# Patient Record
Sex: Female | Born: 1990 | Race: White | Hispanic: No | Marital: Single | Smoking: Never smoker
Health system: Southern US, Community
[De-identification: ages and names within clinical notes are randomized; demographics above are authoritative.]

## PROBLEM LIST (undated history)

## (undated) ENCOUNTER — Inpatient Hospital Stay (HOSPITAL_COMMUNITY): Payer: Self-pay

## (undated) DIAGNOSIS — Z789 Other specified health status: Secondary | ICD-10-CM

## (undated) HISTORY — PX: NO PAST SURGERIES: SHX2092

---

## 2009-03-10 ENCOUNTER — Emergency Department (HOSPITAL_COMMUNITY): Admission: EM | Admit: 2009-03-10 | Discharge: 2009-03-10 | Payer: Self-pay | Admitting: Emergency Medicine

## 2015-06-01 ENCOUNTER — Inpatient Hospital Stay (HOSPITAL_COMMUNITY)
Admission: AD | Admit: 2015-06-01 | Discharge: 2015-06-01 | Disposition: A | Discharge status: Home / Self Care | Payer: Medicaid Other | Source: Ambulatory Visit | Attending: Family Medicine | Admitting: Family Medicine

## 2015-06-01 ENCOUNTER — Encounter (HOSPITAL_COMMUNITY): Payer: Self-pay

## 2015-06-01 DIAGNOSIS — R112 Nausea with vomiting, unspecified: Secondary | ICD-10-CM | POA: Diagnosis present

## 2015-06-01 DIAGNOSIS — Z3A17 17 weeks gestation of pregnancy: Secondary | ICD-10-CM | POA: Diagnosis not present

## 2015-06-01 DIAGNOSIS — O9989 Other specified diseases and conditions complicating pregnancy, childbirth and the puerperium: Secondary | ICD-10-CM | POA: Diagnosis not present

## 2015-06-01 DIAGNOSIS — O26892 Other specified pregnancy related conditions, second trimester: Secondary | ICD-10-CM | POA: Diagnosis not present

## 2015-06-01 DIAGNOSIS — K529 Noninfective gastroenteritis and colitis, unspecified: Secondary | ICD-10-CM

## 2015-06-01 DIAGNOSIS — A084 Viral intestinal infection, unspecified: Secondary | ICD-10-CM | POA: Diagnosis not present

## 2015-06-01 HISTORY — DX: Other specified health status: Z78.9

## 2015-06-01 LAB — URINALYSIS, ROUTINE W REFLEX MICROSCOPIC
BILIRUBIN URINE: NEGATIVE
GLUCOSE, UA: NEGATIVE mg/dL
HGB URINE DIPSTICK: NEGATIVE
Ketones, ur: >80 mg/dL — AB
Leukocytes, UA: NEGATIVE
Nitrite: NEGATIVE
PH: 6 (ref 5.0–8.0)
Protein, ur: NEGATIVE mg/dL
SPECIFIC GRAVITY, URINE: 1.025 (ref 1.005–1.030)

## 2015-06-01 LAB — COMPREHENSIVE METABOLIC PANEL
ALT: 31 U/L (ref 14–54)
AST: 35 U/L (ref 15–41)
Albumin: 3.4 g/dL — ABNORMAL LOW (ref 3.5–5.0)
Alkaline Phosphatase: 45 U/L (ref 38–126)
Anion gap: 8 (ref 5–15)
BUN: 5 mg/dL — ABNORMAL LOW (ref 6–20)
CO2: 22 mmol/L (ref 22–32)
Calcium: 7.9 mg/dL — ABNORMAL LOW (ref 8.9–10.3)
Chloride: 108 mmol/L (ref 101–111)
Creatinine, Ser: 0.45 mg/dL (ref 0.44–1.00)
GFR calc Af Amer: >60 mL/min (ref >60)
GFR calc non Af Amer: >60 mL/min (ref >60)
Glucose, Bld: 76 mg/dL (ref 65–99)
Potassium: 3.6 mmol/L (ref 3.5–5.1)
Sodium: 138 mmol/L (ref 135–145)
Total Bilirubin: 0.8 mg/dL (ref 0.3–1.2)
Total Protein: 6.6 g/dL (ref 6.5–8.1)

## 2015-06-01 LAB — CBC
HCT: 34.9 % — ABNORMAL LOW (ref 36.0–46.0)
Hemoglobin: 12.4 g/dL (ref 12.0–15.0)
MCH: 32.7 pg (ref 26.0–34.0)
MCHC: 35.5 g/dL (ref 30.0–36.0)
MCV: 92.1 fL (ref 78.0–100.0)
Platelets: 180 10*3/uL (ref 150–400)
RBC: 3.79 MIL/uL — ABNORMAL LOW (ref 3.87–5.11)
RDW: 13.3 % (ref 11.5–15.5)
WBC: 6.9 10*3/uL (ref 4.0–10.5)

## 2015-06-01 MED ORDER — PROMETHAZINE HCL 25 MG/ML IJ SOLN
25.0000 mg | Freq: Once | INTRAMUSCULAR | Status: AC
  Administered 2015-06-01: 25 mg via INTRAVENOUS
  Filled 2015-06-01: qty 1

## 2015-06-01 MED ORDER — LACTATED RINGERS IV BOLUS (SEPSIS)
1000.0000 mL | Freq: Once | INTRAVENOUS | Status: AC
  Administered 2015-06-01: 1000 mL via INTRAVENOUS

## 2015-06-01 MED ORDER — DIPHENOXYLATE-ATROPINE 2.5-0.025 MG PO TABS
1.0000 | ORAL_TABLET | Freq: Once | ORAL | Status: AC
  Administered 2015-06-01: 1 via ORAL
  Filled 2015-06-01: qty 1

## 2015-06-01 MED ORDER — ONDANSETRON HCL 8 MG PO TABS: 8.0000 mg | ORAL_TABLET | Freq: Three times a day (TID) | ORAL | Status: DC | PRN

## 2015-06-01 NOTE — Discharge Instructions (Signed)

## 2015-06-01 NOTE — MAU Provider Note (Signed)
  History     CSN: [id number]  Arrival date and time: 06/01/15 1626   First Provider Initiated Contact with Patient 06/01/15 1700      Chief Complaint  Patient presents with  . Nausea  . Emesis  . Diarrhea   HPI  Theresa Watts 25 y.o. G3P2000 @ [redacted]w[redacted]d presents with nusea, vomiting and diarrhea. She has had this for 4 days and is still not feeling better. She states that her children were sick with a stomach bug earlier this week.   Past Medical History  Diagnosis Date  . Medical history non-contributory     Past Surgical History  Procedure Laterality Date  . No past surgeries      No family history on file.  Social History  Substance Use Topics  . Smoking status: Never Smoker   . Smokeless tobacco: None  . Alcohol Use: No    Allergies:  Allergies  Allergen Reactions  . Celebrex [Celecoxib] Hives    No prescriptions prior to admission    Review of Systems  Constitutional: Negative for fever.  Gastrointestinal: Positive for nausea, vomiting, abdominal pain and diarrhea.  All other systems reviewed and are negative.  Physical Exam   Blood pressure 107/58, pulse 89, temperature 99 F (37.2 C), temperature source Oral, resp. rate 16, height  (1.6 m), weight 54.432 kg (120 lb).  Physical Exam  Nursing note and vitals reviewed. Constitutional: She is oriented to person, place, and time. She appears well-developed and well-nourished. No distress.  HENT:  Head: Normocephalic and atraumatic.  Cardiovascular: Normal rate.   Respiratory: No respiratory distress.  GI: Soft. There is no tenderness. There is no rebound and no guarding.  Neurological: She is alert and oriented to person, place, and time.  Skin: Skin is warm and dry.  Psychiatric: She has a normal mood and affect. Her behavior is normal. Judgment and thought content normal.    MAU Course  Procedures  MDM Iv Fluids x 2 bags and antiemtic plus lomotil. Pt is tolerating po fluids and  crackers. And is feeling better.  Assessment and Plan  Gastrointestinal Virus Zofran 8 mg po Discharge   Clemmons,Lori Grissett 06/01/2015, 7:53 PM

## 2015-06-01 NOTE — MAU Note (Signed)
Pt reports n/v/d x 3days.  Is unable to keep anything down.  Was seen at [facility] Wednesday evening and received 2L of fluid.  Pt reports losing 7 lbs since the beginning of pregnancy due to morning sickness.  Reports pain in RLQ.

## 2015-10-01 ENCOUNTER — Inpatient Hospital Stay (HOSPITAL_COMMUNITY)
Admission: AD | Admit: 2015-10-01 | Discharge: 2015-10-01 | Disposition: A | Discharge status: Home / Self Care | Payer: Medicaid Other | Source: Ambulatory Visit | Attending: Obstetrics & Gynecology | Admitting: Obstetrics & Gynecology

## 2015-10-01 ENCOUNTER — Encounter (HOSPITAL_COMMUNITY): Payer: Self-pay | Admitting: *Deleted

## 2015-10-01 DIAGNOSIS — G43009 Migraine without aura, not intractable, without status migrainosus: Secondary | ICD-10-CM

## 2015-10-01 DIAGNOSIS — Z3A35 35 weeks gestation of pregnancy: Secondary | ICD-10-CM | POA: Diagnosis not present

## 2015-10-01 DIAGNOSIS — O99353 Diseases of the nervous system complicating pregnancy, third trimester: Secondary | ICD-10-CM | POA: Diagnosis not present

## 2015-10-01 DIAGNOSIS — G44209 Tension-type headache, unspecified, not intractable: Secondary | ICD-10-CM | POA: Diagnosis present

## 2015-10-01 LAB — CBC WITH DIFFERENTIAL/PLATELET
BASOS PCT: 0 %
Basophils Absolute: 0 10*3/uL (ref 0.0–0.1)
Eosinophils Absolute: 0.1 10*3/uL (ref 0.0–0.7)
Eosinophils Relative: 1 %
HEMATOCRIT: 33.1 % — AB (ref 36.0–46.0)
HEMOGLOBIN: 11.3 g/dL — AB (ref 12.0–15.0)
Lymphocytes Relative: 18 %
Lymphs Abs: 1.5 10*3/uL (ref 0.7–4.0)
MCH: 32.6 pg (ref 26.0–34.0)
MCHC: 34.1 g/dL (ref 30.0–36.0)
MCV: 95.4 fL (ref 78.0–100.0)
MONOS PCT: 3 %
Monocytes Absolute: 0.3 10*3/uL (ref 0.1–1.0)
NEUTROS ABS: 6.3 10*3/uL (ref 1.7–7.7)
NEUTROS PCT: 78 %
Platelets: 165 10*3/uL (ref 150–400)
RBC: 3.47 MIL/uL — AB (ref 3.87–5.11)
RDW: 14 % (ref 11.5–15.5)
WBC: 8.1 10*3/uL (ref 4.0–10.5)

## 2015-10-01 LAB — URINALYSIS, ROUTINE W REFLEX MICROSCOPIC
Bilirubin Urine: NEGATIVE
GLUCOSE, UA: NEGATIVE mg/dL
Hgb urine dipstick: NEGATIVE
KETONES UR: NEGATIVE mg/dL
NITRITE: NEGATIVE
PROTEIN: NEGATIVE mg/dL
Specific Gravity, Urine: 1.025 (ref 1.005–1.030)
pH: 6 (ref 5.0–8.0)

## 2015-10-01 LAB — URINE MICROSCOPIC-ADD ON

## 2015-10-01 LAB — PROTEIN / CREATININE RATIO, URINE
Creatinine, Urine: 157 mg/dL
PROTEIN CREATININE RATIO: 0.11 mg/mg{creat} (ref 0.00–0.15)
TOTAL PROTEIN, URINE: 17 mg/dL

## 2015-10-01 LAB — COMPREHENSIVE METABOLIC PANEL
ALK PHOS: 68 U/L (ref 38–126)
ALT: 13 U/L — AB (ref 14–54)
ANION GAP: 5 (ref 5–15)
AST: 20 U/L (ref 15–41)
Albumin: 2.8 g/dL — ABNORMAL LOW (ref 3.5–5.0)
BILIRUBIN TOTAL: 0.8 mg/dL (ref 0.3–1.2)
BUN: 9 mg/dL (ref 6–20)
CALCIUM: 8.2 mg/dL — AB (ref 8.9–10.3)
CO2: 22 mmol/L (ref 22–32)
CREATININE: 0.46 mg/dL (ref 0.44–1.00)
Chloride: 109 mmol/L (ref 101–111)
Glucose, Bld: 83 mg/dL (ref 65–99)
Potassium: 3.4 mmol/L — ABNORMAL LOW (ref 3.5–5.1)
SODIUM: 136 mmol/L (ref 135–145)
Total Protein: 5.9 g/dL — ABNORMAL LOW (ref 6.5–8.1)

## 2015-10-01 MED ORDER — CYCLOBENZAPRINE HCL 10 MG PO TABS: 10.0000 mg | ORAL_TABLET | Freq: Three times a day (TID) | ORAL | Status: DC | PRN

## 2015-10-01 MED ORDER — BUTALBITAL-APAP-CAFFEINE 50-325-40 MG PO TABS
2.0000 | ORAL_TABLET | Freq: Once | ORAL | Status: AC
  Administered 2015-10-01: 2 via ORAL
  Filled 2015-10-01: qty 2

## 2015-10-01 MED ORDER — LACTATED RINGERS IV BOLUS (SEPSIS): 1000.0000 mL | Freq: Once | INTRAVENOUS | Status: DC

## 2015-10-01 MED ORDER — CYCLOBENZAPRINE HCL 10 MG PO TABS
10.0000 mg | ORAL_TABLET | Freq: Once | ORAL | Status: AC
  Administered 2015-10-01: 10 mg via ORAL
  Filled 2015-10-01: qty 1

## 2015-10-01 MED ORDER — ONDANSETRON 4 MG PO TBDP: 4.0000 mg | ORAL_TABLET | Freq: Four times a day (QID) | ORAL | Status: DC | PRN

## 2015-10-01 MED ORDER — BUTALBITAL-APAP-CAFFEINE 50-325-40 MG PO CAPS: 1.0000 | ORAL_CAPSULE | Freq: Four times a day (QID) | ORAL | Status: DC | PRN

## 2015-10-01 NOTE — MAU Note (Signed)
Urine in lab 

## 2015-10-01 NOTE — MAU Provider Note (Signed)
MAU HISTORY AND PHYSICAL  Chief Complaint:  Tension headache  Cathleen CortiBrittany Seal is a 25 y.o.  G3P2000 with IUP at 3983w2d presenting for tension headache.  Prenatal risk factors: none Pt states she has had frontal headache, "vice grip", 10/10, spots in vision when intense, occurs usually during daytime hours, last 1-4 hours, improves with rest/dark room/ tylenol, associated n/v, no aura prior to starting.  States that this is her 3rd episode in the past 2 weeks.  Prior she has no history of migraines or Fhx. 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  Boyfriend is providing some support but she still completes a majority of house tasks in addition to caring for her 2 other children.  Otherwise reports an unremarkable pregnancy and plans for BTL (consent signed for already) after delivery.  Denies any RUQ pain, CP, SOB, BLE swelling, VB, LOF, urinary symptoms, fever, changes in personality, slurred speech, facial deviation, weakness/numbness in extremities.   Past Medical History  Diagnosis Date  . Medical history non-contributory     Past Surgical History  Procedure Laterality Date  . No past surgeries      No family history on file.  Social History  Substance Use Topics  . Smoking status: Never Smoker   . Smokeless tobacco: None  . Alcohol Use: No    Allergies  Allergen Reactions  . Celebrex [Celecoxib] Hives    Prescriptions prior to admission  Medication Sig Dispense Refill Last Dose  . acetaminophen (TYLENOL) 500 MG tablet Take 500 mg by mouth every 6 (six) hours as needed for mild pain.   Past Week at Unknown time  . ondansetron (ZOFRAN) 8 MG tablet Take 1 tablet (8 mg total) by mouth every 8 (eight) hours as needed for nausea or vomiting. (Patient not taking: Reported on 10/01/2015) 30 tablet 0 Not Taking at Unknown time    Review of Systems - Negative except for what is mentioned in HPI.  Physical Exam  Blood pressure 121/74, pulse 92, temperature 98.4  F (36.9 C), temperature source Oral, resp. rate 12, SpO2 100 %. GENERAL: Well-developed, well-nourished female in no acute distress.  LUNGS: Clear to auscultation bilaterally.  HEART: Regular rate and rhythm. ABDOMEN: Soft, nontender, nondistended, gravid.  EXTREMITIES: Nontender, no edema, 2+ distal pulses. Cervical Exam: Deferred Presentation: cephalic FHT:  Baseline 150 (moving), mod variability, +acels, no decels- reactive Contractions:None   Labs: No results found for this or any previous visit (from the past 24 hour(s)).  Imaging Studies:  No results found.  Assessment: Cathleen CortiBrittany Vankleeck is  25 y.o. G3P2000 at 783w2d presents with tension headache.  Plan: #Tension HA -Give cocktail (LR bolus, fiorcet, flexeril) -PIH labs (cbc, cmp, upc)- wnl -Reassess after therapy, d/c to home with flexeril and some fiorcet.  -F/u with OB pcp as scheduled  Olena LeatherwoodKelly M Jearldine Cassady 6/13/201710:42 AM

## 2015-10-01 NOTE — Discharge Instructions (Signed)

## 2015-10-01 NOTE — MAU Note (Signed)
Pt. States that she has been having migraines for about 2 weeks now. They get so bad that pt. Gets nauseated and she starts seeing dots.  Pt. States at her doctors appointments her BP and urine come back fine.  Pt. States that today that she is having lower abdominal pressure.  Denies any bleeding or leaking of fluid.

## 2015-10-01 NOTE — MAU Provider Note (Signed)
Filed Vitals:   10/01/15 0900  BP: 121/74  Pulse: 92  Temp: 98.4 F (36.9 C)  Resp: 12   I assessed the patient. HA is completely resolved with medication and fluid hydration. I am no concerned for preeclampsia at this point given her normal blood pressures. Her CMP and CBC were wnl. UA was negative for protein.   Federico FlakeKimberly Niles Teryl Mcconaghy, MD

## 2016-04-05 ENCOUNTER — Encounter (HOSPITAL_COMMUNITY): Payer: Self-pay

## 2016-06-08 ENCOUNTER — Encounter: Payer: Self-pay | Admitting: Physician Assistant

## 2016-06-08 ENCOUNTER — Encounter (INDEPENDENT_AMBULATORY_CARE_PROVIDER_SITE_OTHER): Payer: Self-pay

## 2016-06-08 ENCOUNTER — Ambulatory Visit (INDEPENDENT_AMBULATORY_CARE_PROVIDER_SITE_OTHER): Payer: Medicaid Other | Admitting: Physician Assistant

## 2016-06-08 VITALS — BP 129/86 | HR 106 | Temp 98.8°F | Ht 63.0 in | Wt 122.0 lb

## 2016-06-08 DIAGNOSIS — F411 Generalized anxiety disorder: Secondary | ICD-10-CM | POA: Diagnosis not present

## 2016-06-08 DIAGNOSIS — R5383 Other fatigue: Secondary | ICD-10-CM | POA: Diagnosis not present

## 2016-06-08 DIAGNOSIS — F988 Other specified behavioral and emotional disorders with onset usually occurring in childhood and adolescence: Secondary | ICD-10-CM

## 2016-06-08 MED ORDER — AMPHETAMINE-DEXTROAMPHET ER 20 MG PO CP24: 20.0000 mg | ORAL_CAPSULE | Freq: Every day | ORAL | 0 refills | Status: DC

## 2016-06-08 MED ORDER — CITALOPRAM HYDROBROMIDE 20 MG PO TABS
20.0000 mg | ORAL_TABLET | Freq: Every day | ORAL | 5 refills | Status: DC
Start: 2016-06-08 — End: 2016-08-12

## 2016-06-08 NOTE — Progress Notes (Signed)
BP 129/86   Pulse (!) 106   Temp 98.8 F (37.1 C) (Oral)   Ht _0  (1.6 m)   Wt 122 lb (55.3 kg)   BMI 21.61 kg/m    Subjective:    Patient ID: Theresa Watts, female    DOB: 03-02-1991, 26 y.o.   MRN: [id number]  Theresa Watts is a 26 y.o. female presenting on 06/08/2016 for New Patient (Initial Visit); Establish Care; Fatigue; and Trouble focusing  HPI Patient here to be established as new patient at [facility].    She has significant complaints of feeling severe anxiety whenever she is out and about. She is currently enrolled in school. When she is at school she is very anxious. She has difficulty going into stores. She tends to want to be at home the most. She has not had any specific trauma or injury that she can remember. She also has had a significant decrease in her ability to pay attention related to school. 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 She had some issues as a child with ADHD. We have had a long conversation concerning the need to treat the anxiety underneath that is probably aggravating her attention and then to treat her attention. She is agreeable. Past Medical History:  Diagnosis Date  . Medical history non-contributory    Relevant past medical, surgical, family and social history reviewed and updated as indicated. Interim medical history since our last visit reviewed. Allergies and medications reviewed and updated.   Data reviewed from any sources in EPIC.  Review of Systems  Constitutional: Negative.  Negative for activity change, fatigue and fever.  HENT: Negative.   Eyes: Negative.   Respiratory: Negative.  Negative for cough.   Cardiovascular: Negative.  Negative for chest pain.  Gastrointestinal: Negative.  Negative for abdominal pain.  Endocrine: Negative.   Genitourinary: Negative.  Negative for dysuria.    Musculoskeletal: Negative.   Skin: Negative.   Neurological: Negative.   Psychiatric/Behavioral: Positive for decreased concentration and dysphoric mood. Negative for sleep disturbance and suicidal ideas. The patient is nervous/anxious. The patient is not hyperactive.      Social History   Social History  . Marital status: Single    Spouse name: N/A  . Number of children: N/A  . Years of education: N/A   Occupational History  . Not on file.   Social History Main Topics  . Smoking status: Never Smoker  . Smokeless tobacco: Never Used  . Alcohol use No  . Drug use: No  . Sexual activity: Yes   Other Topics Concern  . Not on file   Social History Narrative  . No narrative on file    Past Surgical History:  Procedure Laterality Date  . NO PAST SURGERIES      History reviewed. No pertinent family history.  Allergies as of 06/08/2016      Reactions   Celebrex [celecoxib] Hives      Medication List       Accurate as of 06/08/16  8:44 PM. Always use your most recent med list.          amphetamine-dextroamphetamine 20 MG 24 hr capsule Commonly known as:  ADDERALL XR Take 1 capsule (20 mg total) by mouth daily.   citalopram 20 MG tablet Commonly known as:  CELEXA Take 1 tablet (20 mg total) by  mouth daily.          Objective:    BP 129/86   Pulse (!) 106   Temp 98.8 F (37.1 C) (Oral)   Ht _0  (1.6 m)   Wt 122 lb (55.3 kg)   BMI 21.61 kg/m   Allergies  Allergen Reactions  . Celebrex [Celecoxib] Hives   Wt Readings from Last 3 Encounters:  06/08/16 122 lb (55.3 kg)  06/01/15 120 lb (54.4 kg)    Physical Exam  Constitutional: She is oriented to person, place, and time. She appears well-developed and well-nourished.  HENT:  Head: Normocephalic and atraumatic.  Eyes: Conjunctivae and EOM are normal. Pupils are equal, round, and reactive to light.  Cardiovascular: Normal rate, regular rhythm, normal heart sounds and intact distal pulses.    Pulmonary/Chest: Effort normal and breath sounds normal.  Abdominal: Soft. Bowel sounds are normal.  Neurological: She is alert and oriented to person, place, and time. She has normal reflexes.  Skin: Skin is warm and dry. No rash noted.  Psychiatric: She has a normal mood and affect. Her behavior is normal. Judgment and thought content normal.  Nursing note and vitals reviewed.       Assessment & Plan:   1. Attention deficit disorder (ADD) without hyperactivity - amphetamine-dextroamphetamine (ADDERALL XR) 20 MG 24 hr capsule; Take 1 capsule (20 mg total) by mouth daily.  Dispense: 30 capsule; Refill: 0  2. GAD (generalized anxiety disorder) - citalopram (CELEXA) 20 MG tablet; Take 1 tablet (20 mg total) by mouth daily.  Dispense: 30 tablet; Refill: 5  3. Other fatigue - CBC with Differential/Platelet - CMP14+EGFR - Thyroid Panel With TSH   Continue all other maintenance medications as listed above. Educational handout given for adjustment disorder  Follow up plan: Return in about 4 weeks (around 07/06/2016) for recheck.  Terald Sleeper PA-C [facility] [address]  [place], [place] [address] [phone]   06/08/2016, 8:44 PM

## 2016-06-08 NOTE — Patient Instructions (Signed)
Adjustment Disorder Adjustment disorder is an unusually severe reaction to a stressful life event, such as the loss of a job or physical illness. The event may be any stressful event other than the loss of a loved one. Adjustment disorder may affect your feelings, your thinking, how you act, or a combination of these. It may interfere with personal relationships or with the way you are at work, school, or home. People with this disorder are at risk for suicide and substance abuse. They may develop a more serious mental disorder, such as major depressive disorder or post-traumatic stress disorder. SIGNS AND SYMPTOMS  Symptoms may include:  Sadness, depressed mood, or crying spells.  Loss of enjoyment.  Change in appetite or weight.  Sense of loss or hopelessness.  Thoughts of suicide.  Anxiety, worry, or nervousness.  Trouble sleeping.  Avoiding family and friends.  Poor school performance.  Fighting or vandalism.  Reckless driving.  Skipping school.  Poor work performance.  Ignoring bills. Symptoms of adjustment disorder start within 3 months of the stressful life event. They do not last more than 6 months after the event has ended. DIAGNOSIS  To make a diagnosis, your health care provider will ask about what has happened in your life and how it has affected you. He or she may also ask about your medical history and use of medicines, alcohol, and other substances. Your health care provider may do a physical exam and order lab tests or other studies. You may be referred to a mental health specialist for evaluation. TREATMENT  Treatment options include:  Counseling or talk therapy. Talk therapy is usually provided by mental health specialists.  Medicine. Certain medicines may help with depression, anxiety, and sleep.  Support groups. Support groups offer emotional support, advice, and guidance. They are made up of people who have had similar experiences. HOME CARE  INSTRUCTIONS  Keep all follow-up visits as directed by your health care provider. This is important.  Take medicines only as directed by your health care provider. SEEK MEDICAL CARE IF:  Your symptoms get worse.  SEEK IMMEDIATE MEDICAL CARE IF: You have serious thoughts about hurting yourself or someone else. MAKE SURE YOU:  Understand these instructions.  Will watch your condition.  Will get help right away if you are not doing well or get worse. This information is not intended to replace advice given to you by your health care provider. Make sure you discuss any questions you have with your health care provider. Document Released: 12/09/2005 Document Revised: 07/29/2015 Document Reviewed: 08/29/2013 Elsevier Interactive Patient Education  2017 Elsevier Inc.  

## 2016-06-09 LAB — CMP14+EGFR
A/G RATIO: 1.5 (ref 1.2–2.2)
ALBUMIN: 4.4 g/dL (ref 3.5–5.5)
ALT: 15 IU/L (ref 0–32)
AST: 16 IU/L (ref 0–40)
Alkaline Phosphatase: 61 IU/L (ref 39–117)
BILIRUBIN TOTAL: 0.8 mg/dL (ref 0.0–1.2)
BUN / CREAT RATIO: 9 (ref 9–23)
BUN: 8 mg/dL (ref 6–20)
CALCIUM: 8.9 mg/dL (ref 8.7–10.2)
CO2: 20 mmol/L (ref 18–29)
Chloride: 106 mmol/L (ref 96–106)
Creatinine, Ser: 0.93 mg/dL (ref 0.57–1.00)
GFR, EST AFRICAN AMERICAN: 97 mL/min/{1.73_m2} (ref >59)
GFR, EST NON AFRICAN AMERICAN: 84 mL/min/{1.73_m2} (ref >59)
GLOBULIN, TOTAL: 2.9 g/dL (ref 1.5–4.5)
Glucose: 95 mg/dL (ref 65–99)
POTASSIUM: 4.2 mmol/L (ref 3.5–5.2)
Sodium: 142 mmol/L (ref 134–144)
TOTAL PROTEIN: 7.3 g/dL (ref 6.0–8.5)

## 2016-06-09 LAB — CBC WITH DIFFERENTIAL/PLATELET
BASOS: 0 %
Basophils Absolute: 0 10*3/uL (ref 0.0–0.2)
EOS (ABSOLUTE): 0 10*3/uL (ref 0.0–0.4)
EOS: 1 %
HEMATOCRIT: 40.9 % (ref 34.0–46.6)
HEMOGLOBIN: 13.7 g/dL (ref 11.1–15.9)
IMMATURE GRANS (ABS): 0 10*3/uL (ref 0.0–0.1)
IMMATURE GRANULOCYTES: 0 %
LYMPHS: 20 %
Lymphocytes Absolute: 1.8 10*3/uL (ref 0.7–3.1)
MCH: 31.2 pg (ref 26.6–33.0)
MCHC: 33.5 g/dL (ref 31.5–35.7)
MCV: 93 fL (ref 79–97)
MONOCYTES: 3 %
Monocytes Absolute: 0.3 10*3/uL (ref 0.1–0.9)
Neutrophils Absolute: 6.7 10*3/uL (ref 1.4–7.0)
Neutrophils: 76 %
PLATELETS: 258 10*3/uL (ref 150–379)
RBC: 4.39 x10E6/uL (ref 3.77–5.28)
RDW: 13.3 % (ref 12.3–15.4)
WBC: 8.9 10*3/uL (ref 3.4–10.8)

## 2016-06-09 LAB — THYROID PANEL WITH TSH
FREE THYROXINE INDEX: 2.1 (ref 1.2–4.9)
T3 UPTAKE RATIO: 33 % (ref 24–39)
T4, Total: 6.4 ug/dL (ref 4.5–12.0)
TSH: 0.475 u[IU]/mL (ref 0.450–4.500)

## 2016-07-10 ENCOUNTER — Encounter: Payer: Self-pay | Admitting: Physician Assistant

## 2016-07-10 ENCOUNTER — Ambulatory Visit (INDEPENDENT_AMBULATORY_CARE_PROVIDER_SITE_OTHER): Payer: Medicaid Other | Admitting: Physician Assistant

## 2016-07-10 VITALS — BP 128/83 | HR 111 | Temp 97.7°F | Ht 63.0 in | Wt 117.8 lb

## 2016-07-10 DIAGNOSIS — F411 Generalized anxiety disorder: Secondary | ICD-10-CM

## 2016-07-10 DIAGNOSIS — F988 Other specified behavioral and emotional disorders with onset usually occurring in childhood and adolescence: Secondary | ICD-10-CM

## 2016-07-10 MED ORDER — METHYLPHENIDATE HCL ER 36 MG PO TB24: 36.0000 mg | ORAL_TABLET | Freq: Every day | ORAL | 0 refills | Status: DC

## 2016-07-10 NOTE — Patient Instructions (Signed)
Generalized Anxiety Disorder, Adult Generalized anxiety disorder (GAD) is a mental health disorder. People with this condition constantly worry about everyday events. Unlike normal anxiety, worry related to GAD is not triggered by a specific event. These worries also do not fade or get better with time. GAD interferes with life functions, including relationships, work, and school. GAD can vary from mild to severe. People with severe GAD can have intense waves of anxiety with physical symptoms (panic attacks). What are the causes? The exact cause of GAD is not known. What increases the risk? This condition is more likely to develop in:  Women.  People who have a family history of anxiety disorders.  People who are very shy.  People who experience very stressful life events, such as the death of a loved one.  People who have a very stressful family environment. What are the signs or symptoms? People with GAD often worry excessively about many things in their lives, such as their health and family. They may also be overly concerned about:  Doing well at work.  Being on time.  Natural disasters.  Friendships. Physical symptoms of GAD include:  Fatigue.  Muscle tension or having muscle twitches.  Trembling or feeling shaky.  Being easily startled.  Feeling like your heart is pounding or racing.  Feeling out of breath or like you cannot take a deep breath.  Having trouble falling asleep or staying asleep.  Sweating.  Nausea, diarrhea, or irritable bowel syndrome (IBS).  Headaches.  Trouble concentrating or remembering facts.  Restlessness.  Irritability. How is this diagnosed? Your health care provider can diagnose GAD based on your symptoms and medical history. You will also have a physical exam. The health care provider will ask specific questions about your symptoms, including how severe they are, when they started, and if they come and go. Your health care  provider may ask you about your use of alcohol or drugs, including prescription medicines. Your health care provider may refer you to a mental health specialist for further evaluation. Your health care provider will do a thorough examination and may perform additional tests to rule out other possible causes of your symptoms. To be diagnosed with GAD, a person must have anxiety that:  Is out of his or her control.  Affects several different aspects of his or her life, such as work and relationships.  Causes distress that makes him or her unable to take part in normal activities.  Includes at least three physical symptoms of GAD, such as restlessness, fatigue, trouble concentrating, irritability, muscle tension, or sleep problems. Before your health care provider can confirm a diagnosis of GAD, these symptoms must be present more days than they are not, and they must last for six months or longer. How is this treated? The following therapies are usually used to treat GAD:  Medicine. Antidepressant medicine is usually prescribed for long-term daily control. Antianxiety medicines may be added in severe cases, especially when panic attacks occur.  Talk therapy (psychotherapy). Certain types of talk therapy can be helpful in treating GAD by providing support, education, and guidance. Options include:  Cognitive behavioral therapy (CBT). People learn coping skills and techniques to ease their anxiety. They learn to identify unrealistic or negative thoughts and behaviors and to replace them with positive ones.  Acceptance and commitment therapy (ACT). This treatment teaches people how to be mindful as a way to cope with unwanted thoughts and feelings.  Biofeedback. This process trains you to manage your body's response (  physiological response) through breathing techniques and relaxation methods. You will work with a therapist while machines are used to monitor your physical symptoms.  Stress  management techniques. These include yoga, meditation, and exercise. A mental health specialist can help determine which treatment is best for you. Some people see improvement with one type of therapy. However, other people require a combination of therapies. Follow these instructions at home:  Take over-the-counter and prescription medicines only as told by your health care provider.  Try to maintain a normal routine.  Try to anticipate stressful situations and allow extra time to manage them.  Practice any stress management or self-calming techniques as taught by your health care provider.  Do not punish yourself for setbacks or for not making progress.  Try to recognize your accomplishments, even if they are small.  Keep all follow-up visits as told by your health care provider. This is important. Contact a health care provider if:  Your symptoms do not get better.  Your symptoms get worse.  You have signs of depression, such as:  A persistently sad, cranky, or irritable mood.  Loss of enjoyment in activities that used to bring you joy.  Change in weight or eating.  Changes in sleeping habits.  Avoiding friends or family members.  Loss of energy for normal tasks.  Feelings of guilt or worthlessness. Get help right away if:  You have serious thoughts about hurting yourself or others. If you ever feel like you may hurt yourself or others, or have thoughts about taking your own life, get help right away. You can go to your nearest emergency department or call:  Your local emergency services (911 in the [place]  A suicide crisis helpline, such as the National Suicide Prevention Lifeline at 1-800-273-8255. This is open 24 hours a day. Summary  Generalized anxiety disorder (GAD) is a mental health disorder that involves worry that is not triggered by a specific event.  People with GAD often worry excessively about many things in their lives, such as their health and  family.  GAD may cause physical symptoms such as restlessness, trouble concentrating, sleep problems, frequent sweating, nausea, diarrhea, headaches, and trembling or muscle twitching.  A mental health specialist can help determine which treatment is best for you. Some people see improvement with one type of therapy. However, other people require a combination of therapies. This information is not intended to replace advice given to you by your health care provider. Make sure you discuss any questions you have with your health care provider. Document Released: 08/01/2012 Document Revised: 02/25/2016 Document Reviewed: 02/25/2016 Elsevier Interactive Patient Education  2017 [employer]vier Inc.  

## 2016-07-10 NOTE — Progress Notes (Signed)
BP 128/83   Pulse (!) 111   Temp 97.7 F (36.5 C) (Oral)   Ht 5\' 3"  (1.6 m)   Wt 117 lb 12.8 oz (53.4 kg)   BMI 20.87 kg/m    Subjective:    Patient ID: Theresa Watts, female    DOB: 07-07-1990, 26 y.o.   MRN: [id number]  HPI: Theresa Watts is a 26 y.o. female presenting on 07/10/2016 for Follow-up (3 week rck. Patient states that she does not see much change since starting the adderall)  This patient comes in for periodic recheck on medications and conditions including ADHD and anxiety. She is feeling very improved in her anxiety and social anxiety at school. She was able to give a presentation with a little anxiety associated. The ADHD medication is not helping very much. She has had a great decrease in her appetite. She is awake but also having difficulty going to sleep at night. We are to change medications..   All medications are reviewed today. There are no reports of any problems with the medications. All of the medical conditions are reviewed and updated.  Lab work is reviewed and will be ordered as medically necessary. There are no new problems reported with today's visit.   Relevant past medical, surgical, family and social history reviewed and updated as indicated. Allergies and medications reviewed and updated.  Past Medical History:  Diagnosis Date  . Medical history non-contributory     Past Surgical History:  Procedure Laterality Date  . NO PAST SURGERIES      Review of Systems  Constitutional: Negative.  Negative for activity change, fatigue and fever.  HENT: Negative.   Eyes: Negative.   Respiratory: Negative.  Negative for cough.   Cardiovascular: Negative.  Negative for chest pain.  Gastrointestinal: Negative.  Negative for abdominal pain.  Endocrine: Negative.   Genitourinary: Negative.  Negative for dysuria.  Musculoskeletal: Negative.   Skin: Negative.   Neurological: Negative.     Allergies as of 07/10/2016      Reactions   Celebrex  [celecoxib] Hives      Medication List       Accurate as of 07/10/16  4:30 PM. Always use your most recent med list.          citalopram 20 MG tablet Commonly known as:  CELEXA Take 1 tablet (20 mg total) by mouth daily.   methylphenidate 36 MG CR tablet Commonly known as:  CONCERTA Take 1 tablet (36 mg total) by mouth daily.          Objective:    BP 128/83   Pulse (!) 111   Temp 97.7 F (36.5 C) (Oral)   Ht 5\' 3"  (1.6 m)   Wt 117 lb 12.8 oz (53.4 kg)   BMI 20.87 kg/m   Allergies  Allergen Reactions  . Celebrex [Celecoxib] Hives    Physical Exam  Constitutional: She is oriented to person, place, and time. She appears well-developed and well-nourished.  HENT:  Head: Normocephalic and atraumatic.  Right Ear: Tympanic membrane, external ear and ear canal normal.  Left Ear: Tympanic membrane, external ear and ear canal normal.  Nose: Nose normal. No rhinorrhea.  Mouth/Throat: Oropharynx is clear and moist and mucous membranes are normal. No oropharyngeal exudate or posterior oropharyngeal erythema.  Eyes: Conjunctivae and EOM are normal. Pupils are equal, round, and reactive to light.  Neck: Normal range of motion. Neck supple.  Cardiovascular: Normal rate, regular rhythm, normal heart sounds and intact distal pulses.  Pulmonary/Chest: Effort normal and breath sounds normal.  Abdominal: Soft. Bowel sounds are normal.  Neurological: She is alert and oriented to person, place, and time. She has normal reflexes.  Skin: Skin is warm and dry. No rash noted.  Psychiatric: She has a normal mood and affect. Her behavior is normal. Judgment and thought content normal.        Assessment & Plan:   1. GAD (generalized anxiety disorder)  2. Attention deficit disorder (ADD) without hyperactivity - methylphenidate 36 MG PO CR tablet; Take 1 tablet (36 mg total) by mouth daily.  Dispense: 30 tablet; Refill: 0 Stop adderall   Current Outpatient Prescriptions:  .   citalopram (CELEXA) 20 MG tablet, Take 1 tablet (20 mg total) by mouth daily., Disp: 30 tablet, Rfl: 5 .  methylphenidate 36 MG PO CR tablet, Take 1 tablet (36 mg total) by mouth daily., Disp: 30 tablet, Rfl: 0  Continue all other maintenance medications as listed above.  Follow up plan: Return in about 4 weeks (around 08/07/2016) for recheck.  Educational handout given for adjustment disorder  Remus LofflerAngel S. Sloka Volante PA-C [facility] [address]  [place], [place] [address] [phone]   07/10/2016, 4:30 PM

## 2016-07-13 ENCOUNTER — Telehealth: Payer: Self-pay | Admitting: Physician Assistant

## 2016-07-13 MED ORDER — AMPHETAMINE-DEXTROAMPHETAMINE 10 MG PO TABS: 10.0000 mg | ORAL_TABLET | Freq: Every day | ORAL | 0 refills | Status: DC

## 2016-07-13 NOTE — Telephone Encounter (Signed)
lmtcb

## 2016-07-13 NOTE — Telephone Encounter (Signed)
Patient states her methylphenidate 36 mg is not working and would like to be switched to something else. Please advise and send back to the pools.

## 2016-07-13 NOTE — Telephone Encounter (Signed)
Can you find out the issue? She just started it over the weekend. Is there an allergy? Intolerance?

## 2016-07-13 NOTE — Telephone Encounter (Signed)
Patient states that she would like to go back Adderall not the extended release.

## 2016-07-13 NOTE — Telephone Encounter (Signed)
We can lower it to the very lowest dose. Or take this longer so that she gets used to it.

## 2016-07-13 NOTE — Telephone Encounter (Signed)
Patient states that she is jittery and all over the place. She said she feels like it is just to much. Please advise

## 2016-07-14 NOTE — Telephone Encounter (Signed)
Detailed message left for patient that rx is ready to be picked up 

## 2016-07-16 ENCOUNTER — Telehealth: Payer: Self-pay | Admitting: Physician Assistant

## 2016-07-16 NOTE — Telephone Encounter (Signed)
Pharmacy aware of the change & to stop the concerta

## 2016-07-16 NOTE — Telephone Encounter (Signed)
Covering for PCP  Per notes- this was an intentional change from her provider. Ok to fill.   Pt has stopped concerta.   Murtis SinkSam Dillin Lofgren, MD [facility] 07/16/2016, 1:56 PM

## 2016-08-12 ENCOUNTER — Encounter: Payer: Self-pay | Admitting: Physician Assistant

## 2016-08-12 ENCOUNTER — Ambulatory Visit (INDEPENDENT_AMBULATORY_CARE_PROVIDER_SITE_OTHER): Payer: Medicaid Other | Admitting: Physician Assistant

## 2016-08-12 VITALS — BP 131/78 | HR 87 | Temp 97.7°F | Ht 63.0 in | Wt 113.6 lb

## 2016-08-12 DIAGNOSIS — F988 Other specified behavioral and emotional disorders with onset usually occurring in childhood and adolescence: Secondary | ICD-10-CM

## 2016-08-12 DIAGNOSIS — F411 Generalized anxiety disorder: Secondary | ICD-10-CM | POA: Diagnosis not present

## 2016-08-12 MED ORDER — AMPHETAMINE-DEXTROAMPHETAMINE 10 MG PO TABS: 10.0000 mg | ORAL_TABLET | Freq: Two times a day (BID) | ORAL | 0 refills | Status: DC

## 2016-08-12 MED ORDER — CITALOPRAM HYDROBROMIDE 20 MG PO TABS: 20.0000 mg | ORAL_TABLET | Freq: Every day | ORAL | 5 refills | Status: DC

## 2016-08-12 MED ORDER — AMPHETAMINE-DEXTROAMPHETAMINE 10 MG PO TABS: 10.0000 mg | ORAL_TABLET | Freq: Every day | ORAL | 0 refills | Status: DC

## 2016-08-12 NOTE — Patient Instructions (Signed)

## 2016-08-12 NOTE — Progress Notes (Signed)
BP 131/78   Pulse 87   Temp 97.7 F (36.5 C) (Oral)   Ht  (1.6 m)   Wt 113 lb 9.6 oz (51.5 kg)   BMI 20.12 kg/m    Subjective:    Patient ID: Theresa Watts, female    DOB: 07-Nov-1990, 26 y.o.   MRN: [id number]  HPI: Theresa Watts is a 26 y.o. female presenting on 08/12/2016 for Follow-up (4 week )  This patient comes in for periodic recheck on medications and conditions including ADHD and depression.  She reports that she is doing extremely well overall. She is improved her grades tremendously. 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 She does have a little bit of jittery side effects early in the morning in the first couple hours after taking it. She would like to try taking the 10 mg dose that morning and months. There are no other complaints today.   All medications are reviewed today. There are no reports of any problems with the medications. All of the medical conditions are reviewed and updated.  Lab work is reviewed and will be ordered as medically necessary. There are no new problems reported with today's visit.   Relevant past medical, surgical, family and social history reviewed and updated as indicated. Allergies and medications reviewed and updated.  Past Medical History:  Diagnosis Date  . Medical history non-contributory     Past Surgical History:  Procedure Laterality Date  . NO PAST SURGERIES      Review of Systems  Constitutional: Negative.  Negative for activity change, fatigue and fever.  HENT: Negative.   Eyes: Negative.   Respiratory: Negative.  Negative for cough.   Cardiovascular: Negative.  Negative for chest pain.  Gastrointestinal: Negative.  Negative for abdominal pain.  Endocrine: Negative.   Genitourinary: Negative.  Negative for dysuria.  Musculoskeletal: Negative.   Skin: Negative.   Neurological: Negative.     Allergies as of 08/12/2016      Reactions   Celebrex [celecoxib]  Hives      Medication List       Accurate as of 08/12/16  4:27 PM. Always use your most recent med list.          amphetamine-dextroamphetamine 10 MG tablet Commonly known as:  ADDERALL Take 1-2 tablets (10-20 mg total) by mouth daily with breakfast.   amphetamine-dextroamphetamine 10 MG tablet Commonly known as:  ADDERALL Take 1 tablet (10 mg total) by mouth 2 (two) times daily.   amphetamine-dextroamphetamine 10 MG tablet Commonly known as:  ADDERALL Take 1 tablet (10 mg total) by mouth 2 (two) times daily.   citalopram 20 MG tablet Commonly known as:  CELEXA Take 1 tablet (20 mg total) by mouth daily.          Objective:    BP 131/78   Pulse 87   Temp 97.7 F (36.5 C) (Oral)   Ht  (1.6 m)   Wt 113 lb 9.6 oz (51.5 kg)   BMI 20.12 kg/m   Allergies  Allergen Reactions  . Celebrex [Celecoxib] Hives    Physical Exam  Constitutional: She is oriented to person, place, and time. She appears well-developed and well-nourished.  HENT:  Head: Normocephalic and atraumatic.  Eyes: Conjunctivae and EOM are normal. Pupils are equal, round, and reactive to light.  Cardiovascular: Normal rate, regular rhythm, normal heart sounds and intact distal pulses.   Pulmonary/Chest: Effort normal and breath sounds normal.  Abdominal: Soft. Bowel sounds are normal.  Neurological: She is alert and oriented to person, place, and time. She has normal reflexes.  Skin: Skin is warm and dry. No rash noted.  Psychiatric: She has a normal mood and affect. Her behavior is normal. Judgment and thought content normal.        Assessment & Plan:   1. GAD (generalized anxiety disorder) - citalopram (CELEXA) 20 MG tablet; Take 1 tablet (20 mg total) by mouth daily.  Dispense: 30 tablet; Refill: 5  2. Attention deficit disorder (ADD) without hyperactivity - amphetamine-dextroamphetamine (ADDERALL) 10 MG tablet; Take 1-2 tablets (10-20 mg total) by mouth daily with breakfast.  Dispense:  60 tablet; Refill: 0 - amphetamine-dextroamphetamine (ADDERALL) 10 MG tablet; Take 1 tablet (10 mg total) by mouth 2 (two) times daily.  Dispense: 60 tablet; Refill: 0 - amphetamine-dextroamphetamine (ADDERALL) 10 MG tablet; Take 1 tablet (10 mg total) by mouth 2 (two) times daily.  Dispense: 60 tablet; Refill: 0   Continue all other maintenance medications as listed above.  Follow up plan: Return in about 3 months (around 11/11/2016) for recheck.  Educational handout given for attention deficit  Remus Loffler PA-C [facility] [address]  [place], [place] [address] [phone]   08/12/2016, 4:27 PM

## 2016-10-09 ENCOUNTER — Ambulatory Visit (INDEPENDENT_AMBULATORY_CARE_PROVIDER_SITE_OTHER): Payer: Medicaid Other | Admitting: Physician Assistant

## 2016-10-09 ENCOUNTER — Encounter: Payer: Self-pay | Admitting: Physician Assistant

## 2016-10-09 VITALS — BP 123/77 | HR 89 | Temp 97.3°F | Ht 63.0 in | Wt 115.8 lb

## 2016-10-09 DIAGNOSIS — M7661 Achilles tendinitis, right leg: Secondary | ICD-10-CM | POA: Diagnosis not present

## 2016-10-09 MED ORDER — NAPROXEN 500 MG PO TABS: 500.0000 mg | ORAL_TABLET | Freq: Two times a day (BID) | ORAL | 1 refills | Status: DC

## 2016-10-09 NOTE — Patient Instructions (Signed)
Achilles Tendinitis  Achilles tendinitis is inflammation of the tough, cord-like band that attaches the lower leg muscles to the heel bone (Achilles tendon). This is usually caused by overusing the tendon and the ankle joint.  Achilles tendinitis usually gets better over time with treatment and caring for yourself at home. It can take weeks or months to heal completely.  What are the causes?  This condition may be caused by:  · A sudden increase in exercise or activity, such as running.  · Doing the same exercises or activities (such as jumping) over and over.  · Not warming up calf muscles before exercising.  · Exercising in shoes that are worn out or not made for exercise.  · Having arthritis or a bone growth (spur) on the back of the heel bone. This can rub against the tendon and hurt it.  · Age-related wear and tear. Tendons become less flexible with age and more likely to be injured.    What are the signs or symptoms?  Common symptoms of this condition include:  · Pain in the Achilles tendon or in the back of the leg, just above the heel. The pain usually gets worse with exercise.  · Stiffness or soreness in the back of the leg, especially in the morning.  · Swelling of the skin over the Achilles tendon.  · Thickening of the tendon.  · Bone spurs at the bottom of the Achilles tendon, near the heel.  · Trouble standing on tiptoe.    How is this diagnosed?  This condition is diagnosed based on your symptoms and a physical exam. You may have tests, including:  · X-rays.  · MRI.    How is this treated?  The goal of treatment is to relieve symptoms and help your injury heal. Treatment may include:  · Decreasing or stopping activities that caused the tendinitis. This may mean switching to low-impact exercises like biking or swimming.  · Icing the injured area.  · Doing physical therapy, including strengthening and stretching exercises.  · NSAIDs to help relieve pain and swelling.  · Using supportive shoes, wraps,  heel lifts, or a walking boot (air cast).  · Surgery. This may be done if your symptoms do not improve after 6 months.  · Using high-energy shock wave impulses to stimulate the healing process (extracorporeal shock wave therapy). This is rare.  · Injection of medicines to help relieve inflammation (corticosteroids). This is rare.    Follow these instructions at home:  If you have an air cast:   · Wear the cast as told by your health care provider. Remove it only as told by your health care provider.  · Loosen the cast if your toes tingle, become numb, or turn cold and blue.  Activity   · Gradually return to your normal activities once your health care provider approves. Do not do activities that cause pain.  ? Consider doing low-impact exercises, like cycling or swimming.  · If you have an air cast, ask your health care provider when it is safe for you to drive.  · If physical therapy was prescribed, do exercises as told by your health care provider or physical therapist.  Managing pain, stiffness, and swelling   · Raise (elevate) your foot above the level of your heart while you are sitting or lying down.  · Move your toes often to avoid stiffness and to lessen swelling.  · If directed, put ice on the injured area:  ?   Put ice in a plastic bag.  ? Place a towel between your skin and the bag.  ? Leave the ice on for 20 minutes, 2-3 times a day  General instructions   · If directed, wrap your foot with an elastic bandage or other wrap. This can help keep your tendon from moving too much while it heals. Your health care provider will show you how to wrap your foot correctly.  · Wear supportive shoes or heel lifts only as told by your health care provider.  · Take over-the-counter and prescription medicines only as told by your health care provider.  · Keep all follow-up visits as told by your health care provider. This is important.  Contact a health care provider if:  · You have symptoms that gets worse.  · You have  pain that does not get better with medicine.  · You develop new, unexplained symptoms.  · You develop warmth and swelling in your foot.  · You have a fever.  Get help right away if:  · You have a sudden popping sound or sensation in your Achilles tendon followed by severe pain.  · You cannot move your toes or foot.  · You cannot put any weight on your foot.  Summary  · Achilles tendinitis is inflammation of the tough, cord-like band that attaches the lower leg muscles to the heel bone (Achilles tendon).  · This condition is usually caused by overusing the tendon and the ankle joint. It can also be caused by arthritis or normal aging.  · The most common symptoms of this condition include pain, swelling, or stiffness in the Achilles tendon or in the back of the leg.  · This condition is usually treated with rest, NSAIDs, and physical therapy.  This information is not intended to replace advice given to you by your health care provider. Make sure you discuss any questions you have with your health care provider.  Document Released: 01/14/2005 Document Revised: 02/24/2016 Document Reviewed: 02/24/2016  Elsevier Interactive Patient Education © 2017 [employer]

## 2016-10-11 DIAGNOSIS — M7661 Achilles tendinitis, right leg: Secondary | ICD-10-CM | POA: Insufficient documentation

## 2016-10-11 NOTE — Progress Notes (Signed)
BP 123/77   Pulse 89   Temp 97.3 F (36.3 C) (Oral)   Ht 5\' 3"  (1.6 m)   Wt 115 lb 12.8 oz (52.5 kg)   BMI 20.51 kg/m    Subjective:    Patient ID: Theresa Watts, female    DOB: 1990/05/21, 26 y.o.   MRN: [id number]  HPI: Gavin Faivre is a 26 y.o. female presenting on 10/09/2016 for Foot Pain (bilateral and right side radiates up hip )    Relevant past medical, surgical, family and social history reviewed and updated as indicated. Allergies and medications reviewed and updated.  Past Medical History:  Diagnosis Date  . Medical history non-contributory     Past Surgical History:  Procedure Laterality Date  . NO PAST SURGERIES      Review of Systems  Constitutional: Negative.   HENT: Negative.   Eyes: Negative.   Respiratory: Negative.   Gastrointestinal: Negative.   Genitourinary: Negative.   Musculoskeletal: Positive for arthralgias, gait problem, joint swelling and myalgias.    Allergies as of 10/09/2016      Reactions   Celebrex [celecoxib] Hives      Medication List       Accurate as of 10/09/16 11:59 PM. Always use your most recent med list.          amphetamine-dextroamphetamine 10 MG tablet Commonly known as:  ADDERALL Take 1-2 tablets (10-20 mg total) by mouth daily with breakfast.   amphetamine-dextroamphetamine 10 MG tablet Commonly known as:  ADDERALL Take 1 tablet (10 mg total) by mouth 2 (two) times daily.   amphetamine-dextroamphetamine 10 MG tablet Commonly known as:  ADDERALL Take 1 tablet (10 mg total) by mouth 2 (two) times daily.   citalopram 20 MG tablet Commonly known as:  CELEXA Take 1 tablet (20 mg total) by mouth daily.   naproxen 500 MG tablet Commonly known as:  NAPROSYN Take 1 tablet (500 mg total) by mouth 2 (two) times daily with a meal.          Objective:    BP 123/77   Pulse 89   Temp 97.3 F (36.3 C) (Oral)   Ht 5\' 3"  (1.6 m)   Wt 115 lb 12.8 oz (52.5 kg)   BMI 20.51 kg/m   Allergies    Allergen Reactions  . Celebrex [Celecoxib] Hives    Physical Exam  Constitutional: She is oriented to person, place, and time. She appears well-developed and well-nourished.  HENT:  Head: Normocephalic and atraumatic.  Eyes: Conjunctivae and EOM are normal. Pupils are equal, round, and reactive to light.  Cardiovascular: Normal rate, regular rhythm, normal heart sounds and intact distal pulses.   Pulmonary/Chest: Effort normal and breath sounds normal.  Abdominal: Soft. Bowel sounds are normal.  Musculoskeletal:       Right ankle: She exhibits decreased range of motion, swelling and deformity.       Feet:  Neurological: She is alert and oriented to person, place, and time. She has normal reflexes.  Skin: Skin is warm and dry. No rash noted.  Psychiatric: She has a normal mood and affect. Her behavior is normal. Judgment and thought content normal.  Nursing note and vitals reviewed.       Assessment & Plan:   1. Achilles tendinitis of right lower extremity - naproxen (NAPROSYN) 500 MG tablet; Take 1 tablet (500 mg total) by mouth 2 (two) times daily with a meal.  Dispense: 60 tablet; Refill: 1   Continue all other maintenance medications  as listed above.  Follow up plan: Return if symptoms worsen or fail to improve.  Educational handout given for achilles sports therapy  Remus LofflerAngel S. Panagiota Perfetti PA-C [facility] [address]  [place], [place] [address] [phone]   10/11/2016, 10:06 PM

## 2016-11-11 ENCOUNTER — Encounter: Payer: Self-pay | Admitting: Physician Assistant

## 2016-11-11 ENCOUNTER — Ambulatory Visit (INDEPENDENT_AMBULATORY_CARE_PROVIDER_SITE_OTHER): Payer: Medicaid Other | Admitting: Physician Assistant

## 2016-11-11 VITALS — BP 121/78 | HR 90 | Temp 98.3°F | Ht 63.0 in | Wt 117.0 lb

## 2016-11-11 DIAGNOSIS — F411 Generalized anxiety disorder: Secondary | ICD-10-CM

## 2016-11-11 DIAGNOSIS — F900 Attention-deficit hyperactivity disorder, predominantly inattentive type: Secondary | ICD-10-CM | POA: Diagnosis not present

## 2016-11-11 DIAGNOSIS — Z3042 Encounter for surveillance of injectable contraceptive: Secondary | ICD-10-CM

## 2016-11-11 DIAGNOSIS — F988 Other specified behavioral and emotional disorders with onset usually occurring in childhood and adolescence: Secondary | ICD-10-CM

## 2016-11-11 MED ORDER — AMPHETAMINE-DEXTROAMPHETAMINE 10 MG PO TABS: 10.0000 mg | ORAL_TABLET | Freq: Two times a day (BID) | ORAL | 0 refills | Status: DC

## 2016-11-11 MED ORDER — MEDROXYPROGESTERONE ACETATE 150 MG/ML IM SUSP
150.0000 mg | INTRAMUSCULAR | Status: AC
  Administered 2016-11-11 – 2017-08-09 (×4): 150 mg via INTRAMUSCULAR

## 2016-11-11 MED ORDER — AMPHETAMINE-DEXTROAMPHETAMINE 10 MG PO TABS: 10.0000 mg | ORAL_TABLET | Freq: Every day | ORAL | 0 refills | Status: DC

## 2016-11-11 NOTE — Patient Instructions (Signed)
In a few days you may receive a survey in the mail or online from [employer] regarding your visit with us today. Please take a moment to fill this out. Your feedback is very important to our whole office. It can help us better understand your needs as well as improve your experience and satisfaction. Thank you for taking your time to complete it. We care about you.  Gorge Almanza, PA-C  

## 2016-11-13 NOTE — Progress Notes (Signed)
BP 121/78   Pulse 90   Temp 98.3 F (36.8 C) (Oral)   Ht 5\' 3"  (1.6 m)   Wt 117 lb (53.1 kg)   BMI 20.73 kg/m    Subjective:    Patient ID: Theresa Watts, female    DOB: Jul 29, 1990, 26 y.o.   MRN: [id number]  HPI: Theresa CortiBrittany Paolini is a 26 y.o. female presenting on 11/11/2016 for Medication Refill  This patient comes in for periodic recheck on medications and conditions including ADHD, generalized anxiety, Depo-Provera injection. She has been getting the injections since the delivery of her last child. She has been going to Dr. Madalyn RobMcLeod's office. She is due an injection now. We were able to get information from their office and she is due. It will be administered today. Otherwise she is doing very well with her ADD. 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 She is still been able to balance work and home life very well..   All medications are reviewed today. There are no reports of any problems with the medications. All of the medical conditions are reviewed and updated.  Lab work is reviewed and will be ordered as medically necessary. There are no new problems reported with today's visit.   Relevant past medical, surgical, family and social history reviewed and updated as indicated. Allergies and medications reviewed and updated.  Past Medical History:  Diagnosis Date  . Medical history non-contributory     Past Surgical History:  Procedure Laterality Date  . NO PAST SURGERIES      Review of Systems  Constitutional: Negative.  Negative for activity change, fatigue and fever.  HENT: Negative.   Eyes: Negative.   Respiratory: Negative.  Negative for cough.   Cardiovascular: Negative.  Negative for chest pain.  Gastrointestinal: Negative.  Negative for abdominal pain.  Endocrine: Negative.   Genitourinary: Negative.  Negative for dysuria.  Musculoskeletal: Negative.   Skin: Negative.   Neurological: Negative.     Allergies as of 11/11/2016    Reactions   Celebrex [celecoxib] Hives      Medication List       Accurate as of 11/11/16 11:59 PM. Always use your most recent med list.          amphetamine-dextroamphetamine 10 MG tablet Commonly known as:  ADDERALL Take 1-2 tablets (10-20 mg total) by mouth daily with breakfast.   amphetamine-dextroamphetamine 10 MG tablet Commonly known as:  ADDERALL Take 1 tablet (10 mg total) by mouth 2 (two) times daily.   amphetamine-dextroamphetamine 10 MG tablet Commonly known as:  ADDERALL Take 1 tablet (10 mg total) by mouth 2 (two) times daily.   citalopram 20 MG tablet Commonly known as:  CELEXA Take 1 tablet (20 mg total) by mouth daily.   medroxyPROGESTERone 150 MG/ML injection Commonly known as:  DEPO-PROVERA Inject 150 mg into the muscle every 3 (three) months.   naproxen 500 MG tablet Commonly known as:  NAPROSYN Take 1 tablet (500 mg total) by mouth 2 (two) times daily with a meal.          Objective:    BP 121/78   Pulse 90   Temp 98.3 F (36.8 C) (Oral)   Ht 5\' 3"  (1.6 m)   Wt 117 lb (53.1 kg)   BMI 20.73 kg/m   Allergies  Allergen Reactions  . Celebrex [Celecoxib] Hives    Physical Exam  Constitutional: She is oriented to person, place, and time. She appears well-developed and well-nourished.  HENT:  Head: Normocephalic and atraumatic.  Eyes: Pupils are equal, round, and reactive to light. Conjunctivae and EOM are normal.  Cardiovascular: Normal rate, regular rhythm, normal heart sounds and intact distal pulses.   Pulmonary/Chest: Effort normal and breath sounds normal.  Abdominal: Soft. Bowel sounds are normal.  Neurological: She is alert and oriented to person, place, and time. She has normal reflexes.  Skin: Skin is warm and dry. No rash noted.  Psychiatric: She has a normal mood and affect. Her behavior is normal. Judgment and thought content normal.  Nursing note and vitals reviewed.      Assessment & Plan:   1. Attention deficit  disorder (ADD) without hyperactivity - amphetamine-dextroamphetamine (ADDERALL) 10 MG tablet; Take 1-2 tablets (10-20 mg total) by mouth daily with breakfast.  Dispense: 60 tablet; Refill: 0 - amphetamine-dextroamphetamine (ADDERALL) 10 MG tablet; Take 1 tablet (10 mg total) by mouth 2 (two) times daily.  Dispense: 60 tablet; Refill: 0 - amphetamine-dextroamphetamine (ADDERALL) 10 MG tablet; Take 1 tablet (10 mg total) by mouth 2 (two) times daily.  Dispense: 60 tablet; Refill: 0  2. GAD (generalized anxiety disorder)  3. Encounter for surveillance of injectable contraceptive - medroxyPROGESTERone (DEPO-PROVERA) 150 MG/ML injection; Inject 150 mg into the muscle every 3 (three) months.   Current Outpatient Prescriptions:  .  amphetamine-dextroamphetamine (ADDERALL) 10 MG tablet, Take 1-2 tablets (10-20 mg total) by mouth daily with breakfast., Disp: 60 tablet, Rfl: 0 .  amphetamine-dextroamphetamine (ADDERALL) 10 MG tablet, Take 1 tablet (10 mg total) by mouth 2 (two) times daily., Disp: 60 tablet, Rfl: 0 .  amphetamine-dextroamphetamine (ADDERALL) 10 MG tablet, Take 1 tablet (10 mg total) by mouth 2 (two) times daily., Disp: 60 tablet, Rfl: 0 .  citalopram (CELEXA) 20 MG tablet, Take 1 tablet (20 mg total) by mouth daily., Disp: 30 tablet, Rfl: 5 .  medroxyPROGESTERone (DEPO-PROVERA) 150 MG/ML injection, Inject 150 mg into the muscle every 3 (three) months., Disp: , Rfl:  .  naproxen (NAPROSYN) 500 MG tablet, Take 1 tablet (500 mg total) by mouth 2 (two) times daily with a meal., Disp: 60 tablet, Rfl: 1  Current Facility-Administered Medications:  .  medroxyPROGESTERone (DEPO-PROVERA) injection 150 mg, 150 mg, Intramuscular, Q90 days, Marnie Fazzino S, PA-C, 150 mg at 11/11/16 1636   Continue all other maintenance medications as listed above.  Follow up plan: Return in about 3 months (around 02/11/2017) for recheck, .  Educational handout given for survey  Remus LofflerAngel S. Nezzie Manera PA-C [facility] [address]  [place], [place] [address] [phone]   11/13/2016, 12:10 PM

## 2016-12-07 ENCOUNTER — Ambulatory Visit: Payer: Medicaid Other | Admitting: Physician Assistant

## 2017-02-08 ENCOUNTER — Ambulatory Visit (INDEPENDENT_AMBULATORY_CARE_PROVIDER_SITE_OTHER): Payer: Medicaid Other | Admitting: *Deleted

## 2017-02-08 DIAGNOSIS — Z3042 Encounter for surveillance of injectable contraceptive: Secondary | ICD-10-CM

## 2017-02-08 NOTE — Progress Notes (Signed)
Pt given Medroxyprogesterone inj Tolerated well 

## 2017-02-15 ENCOUNTER — Ambulatory Visit: Payer: Medicaid Other | Admitting: Physician Assistant

## 2017-02-16 ENCOUNTER — Encounter: Payer: Self-pay | Admitting: Physician Assistant

## 2017-05-14 ENCOUNTER — Ambulatory Visit: Payer: Medicaid Other

## 2017-05-14 ENCOUNTER — Ambulatory Visit: Payer: Self-pay

## 2017-05-14 DIAGNOSIS — Z3042 Encounter for surveillance of injectable contraceptive: Secondary | ICD-10-CM

## 2017-05-14 NOTE — Progress Notes (Signed)
Patient given medroxyprogesterone and tolerated well.

## 2017-07-05 ENCOUNTER — Telehealth: Payer: Self-pay | Admitting: Physician Assistant

## 2017-07-05 NOTE — Telephone Encounter (Signed)
Pt aware of due date of 4/12-4/26/19.

## 2017-08-09 ENCOUNTER — Ambulatory Visit (INDEPENDENT_AMBULATORY_CARE_PROVIDER_SITE_OTHER): Payer: Self-pay | Admitting: *Deleted

## 2017-08-09 DIAGNOSIS — Z3042 Encounter for surveillance of injectable contraceptive: Secondary | ICD-10-CM

## 2017-08-09 NOTE — Progress Notes (Signed)
Pt given Medroxyprogesterone inj Tolerated well 

## 2017-11-08 ENCOUNTER — Ambulatory Visit: Payer: Self-pay

## 2017-11-11 ENCOUNTER — Ambulatory Visit: Payer: Self-pay | Admitting: *Deleted

## 2017-11-11 DIAGNOSIS — Z3042 Encounter for surveillance of injectable contraceptive: Secondary | ICD-10-CM

## 2017-11-11 LAB — PREGNANCY, URINE: Preg Test, Ur: NEGATIVE

## 2017-11-11 MED ORDER — MEDROXYPROGESTERONE ACETATE 150 MG/ML IM SUSP
150.0000 mg | INTRAMUSCULAR | Status: DC
  Administered 2017-11-11 – 2018-05-05 (×3): 150 mg via INTRAMUSCULAR

## 2017-11-11 NOTE — Progress Notes (Signed)
Pt given Medroyprogesterone inj Tolerated well 

## 2018-02-07 ENCOUNTER — Ambulatory Visit (INDEPENDENT_AMBULATORY_CARE_PROVIDER_SITE_OTHER): Payer: Self-pay | Admitting: *Deleted

## 2018-02-07 DIAGNOSIS — Z3042 Encounter for surveillance of injectable contraceptive: Secondary | ICD-10-CM

## 2018-02-07 NOTE — Progress Notes (Signed)
Pt given Medroxyprogesterone inj Tolerated well 

## 2018-03-10 ENCOUNTER — Other Ambulatory Visit: Payer: Self-pay | Admitting: Physician Assistant

## 2018-03-10 DIAGNOSIS — F411 Generalized anxiety disorder: Secondary | ICD-10-CM

## 2018-03-11 NOTE — Telephone Encounter (Signed)
[facility] 11/11/16  [facility]  [facility]

## 2018-03-11 NOTE — Telephone Encounter (Signed)
Mailbox full-cb 11/22

## 2018-03-16 ENCOUNTER — Telehealth: Payer: Self-pay | Admitting: Physician Assistant

## 2018-03-16 ENCOUNTER — Other Ambulatory Visit: Payer: Self-pay | Admitting: Physician Assistant

## 2018-03-16 DIAGNOSIS — F411 Generalized anxiety disorder: Secondary | ICD-10-CM

## 2018-03-16 MED ORDER — CITALOPRAM HYDROBROMIDE 20 MG PO TABS: 20.0000 mg | ORAL_TABLET | Freq: Every day | ORAL | 5 refills | Status: DC

## 2018-03-16 NOTE — Telephone Encounter (Signed)
[facility]

## 2018-03-16 NOTE — Telephone Encounter (Signed)
Aware. Script sent to pharmacy. 

## 2018-03-16 NOTE — Telephone Encounter (Signed)
Patient has a follow up appointment scheduled for 04-08-18. Please advise on refill.

## 2018-03-22 NOTE — Telephone Encounter (Signed)
Appointment scheduled.

## 2018-04-08 ENCOUNTER — Encounter: Payer: Self-pay | Admitting: Physician Assistant

## 2018-04-08 ENCOUNTER — Telehealth: Payer: Self-pay | Admitting: Physician Assistant

## 2018-04-08 ENCOUNTER — Ambulatory Visit (INDEPENDENT_AMBULATORY_CARE_PROVIDER_SITE_OTHER): Payer: Self-pay | Admitting: Physician Assistant

## 2018-04-08 VITALS — BP 116/76 | HR 110 | Temp 98.2°F | Ht 63.0 in | Wt 120.6 lb

## 2018-04-08 DIAGNOSIS — M5432 Sciatica, left side: Secondary | ICD-10-CM

## 2018-04-08 DIAGNOSIS — F325 Major depressive disorder, single episode, in full remission: Secondary | ICD-10-CM

## 2018-04-08 DIAGNOSIS — F411 Generalized anxiety disorder: Secondary | ICD-10-CM

## 2018-04-08 DIAGNOSIS — L700 Acne vulgaris: Secondary | ICD-10-CM

## 2018-04-08 MED ORDER — METHOCARBAMOL 500 MG PO TABS: 500.0000 mg | ORAL_TABLET | Freq: Three times a day (TID) | ORAL | 2 refills | Status: DC | PRN

## 2018-04-08 MED ORDER — PREDNISONE 10 MG (48) PO TBPK: ORAL_TABLET | ORAL | 0 refills | Status: DC

## 2018-04-08 MED ORDER — DOXYCYCLINE HYCLATE 100 MG PO TABS: 100.0000 mg | ORAL_TABLET | Freq: Every day | ORAL | 11 refills | Status: DC

## 2018-04-08 MED ORDER — CITALOPRAM HYDROBROMIDE 40 MG PO TABS: 40.0000 mg | ORAL_TABLET | Freq: Every day | ORAL | 2 refills | Status: DC

## 2018-04-08 NOTE — Patient Instructions (Signed)

## 2018-04-08 NOTE — Telephone Encounter (Signed)
Pharmacy aware doxycycline is daily.

## 2018-04-10 ENCOUNTER — Encounter: Payer: Self-pay | Admitting: Physician Assistant

## 2018-04-10 NOTE — Progress Notes (Signed)
BP 116/76   Pulse (!) 110   Temp 98.2 F (36.8 C) (Oral)   Ht 5\' 3"  (1.6 m)   Wt 120 lb 9.6 oz (54.7 kg)   BMI 21.36 kg/m    Subjective:    Patient ID: Theresa CortiBrittany Watts, female    DOB: 1990/09/29, 27 y.o.   MRN: [id number]  HPI: Theresa Watts is a 27 y.o. female presenting on 04/08/2018 for Depression  This patient comes in for periodic recheck on medications and conditions including generalized anxiety, depression, sciatica of the left side.  She is currently without insurance but will have it at the beginning of the year.  She does need a refill on her depression medicine.  She does not feel that it is helping quite enough.  She is never taken any other medication so we will increase this dose first.  She also has had a flareup of acne like to have a refill on doxycycline.  And then she also has a sciatica issue going on right now.  She is only had a little bit of this in the past..   All medications are reviewed today. There are no reports of any problems with the medications. All of the medical conditions are reviewed and updated.  Lab work is reviewed and will be ordered as medically necessary. There are no new problems reported with today's visit.   Past Medical History:  Diagnosis Date  . Medical history non-contributory    Relevant past medical, surgical, family and social history reviewed and updated as indicated. Interim medical history since our last visit reviewed. Allergies and medications reviewed and updated. DATA REVIEWED: CHART IN EPIC  Family History reviewed for pertinent findings.  Review of Systems  Constitutional: Negative.   HENT: Negative.   Eyes: Negative.   Respiratory: Negative.   Gastrointestinal: Positive for abdominal distention.  Genitourinary: Negative.   Musculoskeletal: Positive for arthralgias, back pain, joint swelling and myalgias.  Psychiatric/Behavioral: Positive for decreased concentration and dysphoric mood. The patient is  nervous/anxious.     Allergies as of 04/08/2018      Reactions   Celebrex [celecoxib] Hives      Medication List       Accurate as of April 08, 2018 11:59 PM. Always use your most recent med list.        citalopram 40 MG tablet Commonly known as:  CELEXA Take 1 tablet (40 mg total) by mouth daily.   doxycycline 100 MG tablet Commonly known as:  VIBRA-TABS Take 1 tablet (100 mg total) by mouth daily. 1 po bid   medroxyPROGESTERone 150 MG/ML injection Commonly known as:  DEPO-PROVERA Inject 150 mg into the muscle every 3 (three) months.   methocarbamol 500 MG tablet Commonly known as:  ROBAXIN Take 1 tablet (500 mg total) by mouth every 8 (eight) hours as needed for muscle spasms.   predniSONE 10 MG (48) Tbpk tablet Commonly known as:  STERAPRED UNI-PAK 48 TAB Take as directed for 12 days          Objective:    BP 116/76   Pulse (!) 110   Temp 98.2 F (36.8 C) (Oral)   Ht 5\' 3"  (1.6 m)   Wt 120 lb 9.6 oz (54.7 kg)   BMI 21.36 kg/m   Allergies  Allergen Reactions  . Celebrex [Celecoxib] Hives    Wt Readings from Last 3 Encounters:  04/08/18 120 lb 9.6 oz (54.7 kg)  11/11/16 117 lb (53.1 kg)  10/09/16 115 lb 12.8  oz (52.5 kg)    Physical Exam Constitutional:      Appearance: She is well-developed.  HENT:     Head: Normocephalic and atraumatic.  Eyes:     Conjunctiva/sclera: Conjunctivae normal.     Pupils: Pupils are equal, round, and reactive to light.  Cardiovascular:     Rate and Rhythm: Normal rate and regular rhythm.     Heart sounds: Normal heart sounds.  Pulmonary:     Effort: Pulmonary effort is normal.     Breath sounds: Normal breath sounds.  Abdominal:     General: Bowel sounds are normal.     Palpations: Abdomen is soft.  Musculoskeletal:     Lumbar back: She exhibits decreased range of motion, pain and spasm.       Back:  Skin:    General: Skin is warm and dry.     Findings: No rash.  Neurological:     Mental Status: She  is alert and oriented to person, place, and time.     Deep Tendon Reflexes: Reflexes are normal and symmetric.  Psychiatric:        Behavior: Behavior normal.        Thought Content: Thought content normal.        Judgment: Judgment normal.         Assessment & Plan:   1. GAD (generalized anxiety disorder) - citalopram (CELEXA) 40 MG tablet; Take 1 tablet (40 mg total) by mouth daily.  Dispense: 30 tablet; Refill: 2  2. Depression, major, single episode, complete remission (HCC) - citalopram (CELEXA) 40 MG tablet; Take 1 tablet (40 mg total) by mouth daily.  Dispense: 30 tablet; Refill: 2  3. Acne vulgaris - doxycycline (VIBRA-TABS) 100 MG tablet; Take 1 tablet (100 mg total) by mouth daily. 1 po bid  Dispense: 30 tablet; Refill: 11  4. Sciatica of left side - predniSONE (STERAPRED UNI-PAK 48 TAB) 10 MG (48) TBPK tablet; Take as directed for 12 days  Dispense: 48 tablet; Refill: 0 - methocarbamol (ROBAXIN) 500 MG tablet; Take 1 tablet (500 mg total) by mouth every 8 (eight) hours as needed for muscle spasms.  Dispense: 30 tablet; Refill: 2   Continue all other maintenance medications as listed above.  Follow up plan: No follow-ups on file.  Educational handout given for survey  Remus LofflerAngel S. Adriana Quinby PA-C [facility] [address]  [place], [place] [id number] [phone]   04/10/2018, 8:54 PM

## 2018-05-05 ENCOUNTER — Ambulatory Visit (INDEPENDENT_AMBULATORY_CARE_PROVIDER_SITE_OTHER): Payer: Managed Care, Other (non HMO) | Admitting: *Deleted

## 2018-05-05 ENCOUNTER — Ambulatory Visit: Payer: Self-pay

## 2018-05-05 DIAGNOSIS — Z3042 Encounter for surveillance of injectable contraceptive: Secondary | ICD-10-CM

## 2018-05-05 NOTE — Progress Notes (Signed)
Depoprovera given and patient tolerated well.  Patients next depoprovera due 04/3-04/17

## 2018-05-05 NOTE — Patient Instructions (Signed)

## 2018-07-20 ENCOUNTER — Encounter: Payer: Self-pay | Admitting: Physician Assistant

## 2018-07-20 ENCOUNTER — Ambulatory Visit (INDEPENDENT_AMBULATORY_CARE_PROVIDER_SITE_OTHER): Payer: Managed Care, Other (non HMO) | Admitting: Physician Assistant

## 2018-07-20 ENCOUNTER — Other Ambulatory Visit: Payer: Self-pay

## 2018-07-20 DIAGNOSIS — J0110 Acute frontal sinusitis, unspecified: Secondary | ICD-10-CM | POA: Diagnosis not present

## 2018-07-20 MED ORDER — AMOXICILLIN 500 MG PO CAPS: 500.0000 mg | ORAL_CAPSULE | Freq: Three times a day (TID) | ORAL | 0 refills | Status: DC

## 2018-07-20 MED ORDER — PREDNISONE 10 MG (21) PO TBPK: ORAL_TABLET | ORAL | 0 refills | Status: DC

## 2018-07-20 NOTE — Progress Notes (Signed)
   Telephone visit  Subjective: Theresa Watts pressure PCP: Remus Loffler, PA-C Theresa Watts is a 28 y.o. female calls for telephone consult today. Patient provides verbal consent for consult held via phone.  Patient is identified with 2 separate identifiers.  At this time the entire area is on COVID-19 social distancing and stay home orders are in place.  Patient is of higher risk and therefore we are performing this by a virtual method.  Location of patient: home Location of provider: [facility] Others present for call: no  This patient has had many days of sinus headache and postnasal drainage. There is copious drainage at times. Denies any fever at this time. There has been a little history of sinus infections in the past.  No history of sinus surgery. There is cough at night. It has become more prevalent in recent days.  No nausea vomiting or diarrhea, she has tried ampm cough cold medicine, Mucinex without much relief.  She had been running a 99 or so fever.  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  And we need to have her be out of work for couple of days.  ROS: Per HPI  Allergies  Allergen Reactions  . Celebrex [Celecoxib] Hives   Past Medical History:  Diagnosis Date  . Medical history non-contributory     Current Outpatient Medications:  .  citalopram (CELEXA) 40 MG tablet, Take 1 tablet (40 mg total) by mouth daily., Disp: 30 tablet, Rfl: 2 .  doxycycline (VIBRA-TABS) 100 MG tablet, Take 1 tablet (100 mg total) by mouth daily. 1 po bid, Disp: 30 tablet, Rfl: 11 .  medroxyPROGESTERone (DEPO-PROVERA) 150 MG/ML injection, Inject 150 mg into the muscle every 3 (three) months., Disp: , Rfl:  .  methocarbamol (ROBAXIN) 500 MG tablet, Take 1 tablet (500 mg total) by mouth every 8 (eight) hours as needed for muscle spasms., Disp: 30 tablet, Rfl: 2 .  predniSONE (STERAPRED UNI-PAK 48 TAB) 10 MG (48) TBPK tablet, Take as directed for 12 days, Disp: 48 tablet, Rfl: 0  Current  Facility-Administered Medications:  .  medroxyPROGESTERone (DEPO-PROVERA) injection 150 mg, 150 mg, Intramuscular, Q90 days, Prudy Feeler S, PA-C, 150 mg at 05/05/18 1408  Assessment/ Plan: 28 y.o. female   1. Acute non-recurrent frontal sinusitis - amoxicillin (AMOXIL) 500 MG capsule; Take 1 capsule (500 mg total) by mouth 3 (three) times daily.  Dispense: 30 capsule; Refill: 0 - predniSONE (STERAPRED UNI-PAK 21 TAB) 10 MG (21) TBPK tablet; As directed x 6 days  Dispense: 21 tablet; Refill: 0   Start time: 2:23 PM End time: 2:29 PM  No orders of the defined types were placed in this encounter.   Prudy Feeler PA-C [facility] [phone]

## 2018-07-23 ENCOUNTER — Other Ambulatory Visit: Payer: Self-pay | Admitting: Family

## 2018-07-23 DIAGNOSIS — J019 Acute sinusitis, unspecified: Secondary | ICD-10-CM

## 2018-07-23 MED ORDER — DOXYCYCLINE HYCLATE 100 MG PO TABS: 100.0000 mg | ORAL_TABLET | Freq: Two times a day (BID) | ORAL | 0 refills | Status: DC

## 2018-07-23 MED ORDER — ALBUTEROL SULFATE HFA 108 (90 BASE) MCG/ACT IN AERS: 2.0000 | INHALATION_SPRAY | Freq: Four times a day (QID) | RESPIRATORY_TRACT | 2 refills | Status: DC | PRN

## 2018-07-23 MED ORDER — BENZONATATE 200 MG PO CAPS: 200.0000 mg | ORAL_CAPSULE | Freq: Three times a day (TID) | ORAL | 1 refills | Status: DC | PRN

## 2018-07-23 NOTE — Progress Notes (Signed)
Virtual Visit via telephone Note  I connected with Theresa Watts on 07/23/18 at 9:10 AM  by telephone and verified that I am speaking with the correct person using two identifiers. Theresa Watts Watts currently located at home and no one Watts currently with her during visit. The provider, Theresa Rodney, FNP Watts located in their office at time of visit.  I discussed the limitations, risks, security and privacy concerns of performing an evaluation and management service by telephone and the availability of in person appointments. I also discussed with the patient that there may be a patient responsible charge related to this service. The patient expressed understanding and agreed to proceed.   History and Present Illness:  Pt calls with worsening cough. She was seen on 07/20/18 and prescribed amoxicillin 500 mg TID for 10 days and given prednisone. She states she has no relief and continues cough, congestion, headache, chest pain when coughing, mild sinus pain and low grade fever. States the sinus pressure Watts a 7 out 10 and Watts constant.   Cough  This Watts a new problem. The current episode started in the past 7 days. The problem has been unchanged. The problem occurs every few minutes. The cough Watts non-productive. Associated symptoms include chest pain, a fever, headaches, nasal congestion and shortness of breath (at time). Pertinent negatives include no chills, ear congestion, ear pain, myalgias, postnasal drip, sore throat or wheezing. She has tried rest (amocillin) for the symptoms. The treatment provided no relief. There Watts no history of asthma or COPD.      Review of Systems  Constitutional: Positive for fever. Negative for chills.  HENT: Negative for ear pain, postnasal drip and sore throat.   Respiratory: Positive for cough and shortness of breath (at time). Negative for wheezing.   Cardiovascular: Positive for chest pain.  Musculoskeletal: Negative for myalgias.  Neurological:  Positive for headaches.  All other systems reviewed and are negative.      Observations/Objective: No SOB or distress  Assessment and Plan: 1. Acute sinusitis, recurrence not specified, unspecified location - Take meds as prescribed, stop amoxicillin  - Use a cool mist humidifier  -Use saline nose sprays frequently -Force fluids -For any cough or congestion  Use plain Mucinex- regular strength or max strength Watts fine -For fever or aces or pains- take tylenol or ibuprofen. -Throat lozenges if help -New toothbrush in 3 days RTO if symptoms worsen or do not improve  - benzonatate (TESSALON) 200 MG capsule; Take 1 capsule (200 mg total) by mouth 3 (three) times daily as needed.  Dispense: 30 capsule; Refill: 1 - doxycycline (VIBRA-TABS) 100 MG tablet; Take 1 tablet (100 mg total) by mouth 2 (two) times daily.  Dispense: 20 tablet; Refill: 0 - albuterol (PROVENTIL HFA;VENTOLIN HFA) 108 (90 Base) MCG/ACT inhaler; Inhale 2 puffs into the lungs every 6 (six) hours as needed for wheezing or shortness of breath.  Dispense: 1 Inhaler; Refill: 2   Note given for work  I discussed the assessment and treatment plan with the patient. The patient was provided an opportunity to ask questions and all were answered. The patient agreed with the plan and demonstrated an understanding of the instructions.   The patient was advised to call back or seek an in-person evaluation if the symptoms worsen or if the condition fails to improve as anticipated.  The above assessment and management plan was discussed with the patient. The patient verbalized understanding of and has agreed to the management plan. Patient Watts  aware to call the clinic if symptoms persist or worsen. Patient Watts aware when to return to the clinic for a follow-up visit. Patient educated on when it Watts appropriate to go to the emergency department.    Call ended 9:23 AM, I provided 13  minutes of non-face-to-face time during this encounter.     Theresa Rodney, FNP

## 2018-07-25 ENCOUNTER — Other Ambulatory Visit: Payer: Self-pay | Admitting: Physician Assistant

## 2018-08-05 ENCOUNTER — Ambulatory Visit (INDEPENDENT_AMBULATORY_CARE_PROVIDER_SITE_OTHER): Payer: Managed Care, Other (non HMO) | Admitting: *Deleted

## 2018-08-05 ENCOUNTER — Other Ambulatory Visit: Payer: Self-pay

## 2018-08-05 DIAGNOSIS — Z3042 Encounter for surveillance of injectable contraceptive: Secondary | ICD-10-CM

## 2018-08-05 MED ORDER — MEDROXYPROGESTERONE ACETATE 150 MG/ML IM SUSP
150.0000 mg | INTRAMUSCULAR | Status: AC
  Administered 2018-08-05 – 2020-05-03 (×8): 150 mg via INTRAMUSCULAR

## 2018-08-17 ENCOUNTER — Other Ambulatory Visit: Payer: Self-pay

## 2018-08-17 ENCOUNTER — Encounter: Payer: Self-pay | Admitting: Physician Assistant

## 2018-08-17 ENCOUNTER — Ambulatory Visit (INDEPENDENT_AMBULATORY_CARE_PROVIDER_SITE_OTHER): Payer: Managed Care, Other (non HMO) | Admitting: Physician Assistant

## 2018-08-17 DIAGNOSIS — F320 Major depressive disorder, single episode, mild: Secondary | ICD-10-CM

## 2018-08-17 DIAGNOSIS — F411 Generalized anxiety disorder: Secondary | ICD-10-CM

## 2018-08-17 MED ORDER — DULOXETINE HCL 30 MG PO CPEP: 30.0000 mg | ORAL_CAPSULE | Freq: Every day | ORAL | 3 refills | Status: DC

## 2018-08-17 NOTE — Progress Notes (Signed)
Telephone visit  Subjective: CC: Depression and side effects PCP: Remus Loffler, PA-C NVB:[id number] Haugh is a 28 y.o. female calls for telephone consult today. Patient provides verbal consent for consult held via phone.  Patient is identified with 2 separate identifiers.  At this time the entire area is on COVID-19 social distancing and stay home orders are in place.  Patient is of higher risk and therefore we are performing this by a virtual method.  Location of patient: home Location of provider: [facility] Others present for call: no  The patient is having a visit today concerning her depression and anxiety.  She states that she is very symptomatic at this time.  When Celexa was increased to 40 mg it was creating a lot of nausea.  She tried to take it at nighttime to see if she could just sleep through it.  However it kept her awake.  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  She is quite sad and anxious at this time and particularly with COVID-19 situations going on.  She also had a side effect of decreased libido while she was taking the Celexa.  She has tapered off of it herself.  We have had a discussion about duloxetine as an SNRI medication.  We are going to start this medicine.  She is to call us if there are any side effects.  ROS: Per HPI  Allergies  Allergen Reactions  . Celebrex [Celecoxib] Hives   Past Medical History:  Diagnosis Date  . Medical history non-contributory     Current Outpatient Medications:  .  albuterol (PROVENTIL HFA;VENTOLIN HFA) 108 (90 Base) MCG/ACT inhaler, Inhale 2 puffs into the lungs every 6 (six) hours as needed for wheezing or shortness of breath., Disp: 1 Inhaler, Rfl: 2 .  benzonatate (TESSALON) 200 MG capsule, Take 1 capsule (200 mg total) by mouth 3 (three) times daily as needed., Disp: 30 capsule, Rfl: 1 .  DULoxetine (CYMBALTA) 30 MG capsule, Take 1 capsule (30 mg total) by  mouth daily. After 7 days, start 2 tabs daily., Disp: 60 capsule, Rfl: 3 .  medroxyPROGESTERone (DEPO-PROVERA) 150 MG/ML injection, Inject 150 mg into the muscle every 3 (three) months., Disp: , Rfl:  .  methocarbamol (ROBAXIN) 500 MG tablet, Take 1 tablet (500 mg total) by mouth every 8 (eight) hours as needed for muscle spasms., Disp: 30 tablet, Rfl: 2  Current Facility-Administered Medications:  .  medroxyPROGESTERone (DEPO-PROVERA) injection 150 mg, 150 mg, Intramuscular, Q90 days, Prudy Feeler S, PA-C, 150 mg at 08/05/18 1145  Assessment/ Plan: 28 y.o. female    1. GAD (generalized anxiety disorder) - DULoxetine (CYMBALTA) 30 MG capsule; Take 1 capsule (30 mg total) by mouth daily. After 7 days, start 2 tabs daily.  Dispense: 60 capsule; Refill: 3  2. Depression, major, single episode, mild (HCC) - DULoxetine (CYMBALTA) 30 MG capsule; Take 1 capsule (30 mg total) by mouth daily. After 7 days, start 2 tabs daily.  Dispense: 60 capsule; Refill: 3  Recheck in 4 weeks.  Start time: 11:21 AM End time: 11:35 AM  Meds ordered this encounter  Medications  . DULoxetine (CYMBALTA) 30 MG capsule    Sig: Take 1 capsule (30 mg total) by mouth daily. After 7 days, start 2 tabs daily.    Dispense:  60 capsule    Refill:  3    Order Specific Question:   Supervising Provider  Answer:   Janora Norlander [[id number]]    Particia Nearing PA-C [place] [phone]

## 2018-08-29 ENCOUNTER — Telehealth: Payer: Self-pay | Admitting: Physician Assistant

## 2018-08-29 ENCOUNTER — Other Ambulatory Visit: Payer: Self-pay

## 2018-08-29 ENCOUNTER — Ambulatory Visit (INDEPENDENT_AMBULATORY_CARE_PROVIDER_SITE_OTHER): Payer: Managed Care, Other (non HMO) | Admitting: Family Medicine

## 2018-08-29 DIAGNOSIS — F321 Major depressive disorder, single episode, moderate: Secondary | ICD-10-CM

## 2018-08-29 DIAGNOSIS — F339 Major depressive disorder, recurrent, unspecified: Secondary | ICD-10-CM | POA: Insufficient documentation

## 2018-08-29 DIAGNOSIS — F411 Generalized anxiety disorder: Secondary | ICD-10-CM | POA: Diagnosis not present

## 2018-08-29 MED ORDER — FLUOXETINE HCL 20 MG PO CAPS: 20.0000 mg | ORAL_CAPSULE | Freq: Every day | ORAL | 1 refills | Status: DC

## 2018-08-29 NOTE — Patient Instructions (Signed)
Taking the medicine as directed and not missing any doses is one of the best things you can do to treat your depression.  Here are some things to keep in mind:  1) Side effects (stomach upset, some increased anxiety) may happen before you notice a benefit.  These side effects typically go away over time. 2) Changes to your dose of medicine or a change in medication all together is sometimes necessary 3) Most people need to be on medication at least 12 months 4) Many people will notice an improvement within two weeks but the full effect of the medication can take up to 4-6 weeks 5) Stopping the medication when you start feeling better often results in a return of symptoms 6) Never discontinue your medication without contacting a health care professional first.  Some medications require gradual discontinuation/ taper and can make you sick if you stop them abruptly.  If your symptoms worsen or you have thoughts of suicide/homicide, PLEASE SEEK IMMEDIATE MEDICAL ATTENTION.  You may always call:  National Suicide Hotline: 800-273-8255 [facility] Crisis Line: 336-832-9700 Crisis Recovery in [place]: 800-939-5911   These are available 24 hours a day, 7 days a week.   

## 2018-08-29 NOTE — Progress Notes (Signed)
Telephone visit  Subjective: CC: depression/ anxiety PCP: Remus Loffler, PA-C [id number] Theresa Watts is a 28 y.o. female calls for telephone consult today. Patient provides verbal consent for consult held via phone.  Location of patient: home Location of provider: [facility] Others present for call: children  1. Depression/ anxiety Patient was recently seen by her PCP on 08/17/2018, at which time her Celexa was discontinued and she was transitioned over to Cymbalta.  She notes that as soon as starting Cymbalta she started developing headaches which became more severe as she increase the dose.  She self discontinued after taking 1 dose of 60 mg.  She notes that she had substantial irritability and "a mental breakdown" last night.  She had been transitioned from the Celexa because of decreased sex drive and increased insomnia.  She reports mood swings and frequent crying episodes.  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   ROS: Per HPI  Allergies  Allergen Reactions  . Celebrex [Celecoxib] Hives   Past Medical History:  Diagnosis Date  . Medical history non-contributory     Current Outpatient Medications:  .  albuterol (PROVENTIL HFA;VENTOLIN HFA) 108 (90 Base) MCG/ACT inhaler, Inhale 2 puffs into the lungs every 6 (six) hours as needed for wheezing or shortness of breath., Disp: 1 Inhaler, Rfl: 2 .  benzonatate (TESSALON) 200 MG capsule, Take 1 capsule (200 mg total) by mouth 3 (three) times daily as needed., Disp: 30 capsule, Rfl: 1 .  DULoxetine (CYMBALTA) 30 MG capsule, Take 1 capsule (30 mg total) by mouth daily. After 7 days, start 2 tabs daily., Disp: 60 capsule, Rfl: 3 .  medroxyPROGESTERone (DEPO-PROVERA) 150 MG/ML injection, Inject 150 mg into the muscle every 3 (three) months., Disp: , Rfl:  .  methocarbamol (ROBAXIN) 500 MG tablet, Take 1 tablet (500 mg total) by mouth every 8 (eight) hours as needed for muscle spasms., Disp: 30 tablet, Rfl: 2  Current  Facility-Administered Medications:  .  medroxyPROGESTERone (DEPO-PROVERA) injection 150 mg, 150 mg, Intramuscular, Q90 days, Jones, Angel S, PA-C, 150 mg at 08/05/18 1145  Depression screen [facility] 2/9 08/29/2018 11/11/2016 08/12/2016  Decreased Interest 2 0 0  Down, Depressed, Hopeless 3 0 0  PHQ - 2 Score 5 0 0  Altered sleeping 1 - -  Tired, decreased energy 3 - -  Change in appetite 0 - -  Feeling bad or failure about yourself  2 - -  Trouble concentrating 2 - -  Moving slowly or fidgety/restless 1 - -  Suicidal thoughts 0 - -  PHQ-9 Score 14 - -  Difficult doing work/chores Very difficult - -   GAD 7 : Generalized Anxiety Score 08/29/2018  Nervous, Anxious, on Edge 3  Control/stop worrying 3  Worry too much - different things 3  Trouble relaxing 2  Restless 2  Easily annoyed or irritable 3  Afraid - awful might happen 1  Total GAD 7 Score 17  Anxiety Difficulty Very difficult    Assessment/ Plan: 28 y.o. female   1. GAD (generalized anxiety disorder) We discussed various options including revisiting the SSRI class with fluoxetine versus transitioning to Trintellix versus changing classes totally to something like Latuda.  She scored very positive in both her anxiety and depressive scales today.  She wants to pursue Prozac and we are starting this at 20 mg daily.  She will follow-up with either myself or her PCP in 4 weeks for recheck.  If she has improvement  in symptoms but persistent symptoms would recommend increasing to 40 mg of Prozac daily.  If she does not feel a substantial improvement then would consider transition over to Trintellix.  Alternatively, we discussed adding buspirone or Wellbutrin if needed for insufficiently treated anxiety or depression.  Though would certainly maximize single-dose therapy with Prozac before adding adjunct of medications. - FLUoxetine (PROZAC) 20 MG capsule; Take 1 capsule (20 mg total) by mouth daily.  Dispense: 30 capsule; Refill: 1  2.  Depression, major, single episode, moderate (HCC) As above - FLUoxetine (PROZAC) 20 MG capsule; Take 1 capsule (20 mg total) by mouth daily.  Dispense: 30 capsule; Refill: 1   Start time: 1:11pm End time: 1:33pm  Total time spent on patient care (including telephone call/ virtual visit): 25 minutes  Theresa Watts Hulen SkainsM Gerritt Galentine, DO [facility] [phone]

## 2018-08-29 NOTE — Telephone Encounter (Signed)
appt made

## 2018-09-07 ENCOUNTER — Ambulatory Visit: Payer: Managed Care, Other (non HMO) | Admitting: Physician Assistant

## 2018-09-07 ENCOUNTER — Other Ambulatory Visit: Payer: Self-pay

## 2018-09-15 ENCOUNTER — Ambulatory Visit (INDEPENDENT_AMBULATORY_CARE_PROVIDER_SITE_OTHER): Payer: Managed Care, Other (non HMO) | Admitting: Family Medicine

## 2018-09-15 ENCOUNTER — Other Ambulatory Visit: Payer: Self-pay

## 2018-09-15 ENCOUNTER — Ambulatory Visit (HOSPITAL_COMMUNITY)
Admission: RE | Admit: 2018-09-15 | Discharge: 2018-09-15 | Disposition: A | Discharge status: Home / Self Care | Payer: Managed Care, Other (non HMO) | Source: Ambulatory Visit | Attending: Family Medicine | Admitting: Family Medicine

## 2018-09-15 ENCOUNTER — Encounter: Payer: Self-pay | Admitting: Family Medicine

## 2018-09-15 VITALS — BP 131/81 | HR 104 | Temp 98.8°F | Ht 63.0 in | Wt 125.0 lb

## 2018-09-15 DIAGNOSIS — E041 Nontoxic single thyroid nodule: Secondary | ICD-10-CM | POA: Diagnosis present

## 2018-09-15 DIAGNOSIS — E042 Nontoxic multinodular goiter: Secondary | ICD-10-CM

## 2018-09-15 MED ORDER — NAPROXEN 500 MG PO TABS: 500.0000 mg | ORAL_TABLET | Freq: Two times a day (BID) | ORAL | 1 refills | Status: DC

## 2018-09-15 NOTE — Patient Instructions (Signed)
Thyroid Nodule    A thyroid nodule is an isolated growth of thyroid cells that forms a lump in your thyroid gland. The thyroid gland is a butterfly-shaped gland. It is found in the lower front of your neck. This gland sends chemical messengers (hormones) through your blood to all parts of your body. These hormones are important in regulating your body temperature and helping your body to use energy. Thyroid nodules are common. Most are not cancerous (are benign). You may have one nodule or several nodules.  Different types of thyroid nodules include:  · Nodules that grow and fill with fluid (thyroid cysts).  · Nodules that produce too much thyroid hormone (hot nodules or hyperthyroid).  · Nodules that produce no thyroid hormone (cold nodules or hypothyroid).  · Nodules that form from cancer cells (thyroid cancers).  What are the causes?  Usually, the cause of this condition is not known.  What increases the risk?  Factors that make this condition more likely to develop include:  · Increasing age. Thyroid nodules become more common in people who are older than 30 years of age.  · Gender.  ? Benign thyroid nodules are more common in women.  ? Cancerous (malignant) thyroid nodules are more common in men.  · A family history that includes:  ? Thyroid nodules.  ? Pheochromocytoma.  ? Thyroid carcinoma.  ? Hyperparathyroidism.  · Certain kinds of thyroid diseases, such as Hashimoto thyroiditis.  · Lack of iodine.  · A history of head and neck radiation, such as from X-rays.  What are the signs or symptoms?  It is common for this condition to cause no symptoms. If you have symptoms, they may include:  · A lump in your lower neck.  · Feeling a lump or tickle in your throat.  · Pain in your neck, jaw, or ear.  · Having trouble swallowing.  Hot nodules may cause symptoms that include:  · Weight loss.  · Warm, flushed skin.  · Feeling hot.  · Feeling nervous.  · A racing heartbeat.  Cold nodules may cause symptoms that  include:  · Weight gain.  · Dry skin.  · Brittle hair. This may also occur with hair loss.  · Feeling cold.  · Fatigue.  Thyroid cancer nodules may cause symptoms that include:  · Hard nodules that feel stuck to the thyroid gland.  · Hoarseness.  · Lumps in the glands near your thyroid (lymph nodes).  How is this diagnosed?  A thyroid nodule may be felt by your health care provider during a physical exam. This condition may also be diagnosed based on your symptoms. You may also have tests, including:  · An ultrasound. This may be done to confirm the diagnosis.  · A biopsy. This involves taking a sample from the nodule and looking at it under a microscope to see if the nodule is benign.  · Blood tests to make sure that your thyroid is working properly.  · Imaging tests such as MRI or CT scan may be done if:  ? Your nodule is large.  ? Your nodule is blocking your airway.  ? Cancer is suspected.  How is this treated?  Treatment depends on the cause and size of your nodule or nodules. If the nodule is benign, treatment may not be necessary. Your health care provider may monitor the nodule to see if it goes away without treatment. If the nodule continues to grow, is cancerous, or does not go away:  ·   It may need to be drained with a needle.  · It may need to be removed with surgery.  If you have surgery, part or all of your thyroid gland may need to be removed as well.  Follow these instructions at home:  · Pay attention to any changes in your nodule.  · Take over-the-counter and prescription medicines only as told by your health care provider.  · Keep all follow-up visits as told by your health care provider. This is important.  Contact a health care provider if:  · Your voice changes.  · You have trouble swallowing.  · You have pain in your neck, ear, or jaw that is getting worse.  · Your nodule gets bigger.  · Your nodule starts to make it harder for you to breathe.  Get help right away if:  · You have a sudden  fever.  · You feel very weak.  · Your muscles look like they are shrinking (muscle wasting).  · You have mood swings.  · You feel very restless.  · You feel confused.  · You are seeing or hearing things that other people do not see or hear (having hallucinations).  · You feel suddenly nauseous or throw up.  · You suddenly have diarrhea.  · You have chest pain.  · There is a loss of consciousness.  This information is not intended to replace advice given to you by your health care provider. Make sure you discuss any questions you have with your health care provider.  Document Released: 02/28/2004 Document Revised: 12/08/2015 Document Reviewed: 07/18/2014  Elsevier Interactive Patient Education © 2019 [employer]

## 2018-09-15 NOTE — Addendum Note (Signed)
Addended by: Sonny Masters on: 09/15/2018 04:53 PM   Modules accepted: Orders

## 2018-09-15 NOTE — Progress Notes (Signed)
Subjective:  Patient ID: Theresa Watts, female    DOB: 1990/09/26, 28 y.o.   MRN: [id number]  Chief Complaint:  Lump in neck (Painful. Started this AM)   HPI: Theresa Watts is a 28 y.o. female presenting on 09/15/2018 for Lump in neck (Painful. Started this AM)   1. Thyroid nodule   Pt presents today with complaints of a tender lump in her neck. Pt states she noticed the lump this morning. She denies a personal or family history of thyroid disease. No fever, chills, weight loss, night sweats, heat or cold intolerance, hair or skin changes. She states the does have some arthralgias and mood swings. States this has been ongoing for several months. No diarrhea, constipation, palpitations, chest pain, shortness of breath, insomnia, or fatigue.    Relevant past medical, surgical, family, and social history reviewed and updated as indicated.  Allergies and medications reviewed and updated.   Past Medical History:  Diagnosis Date  . Medical history non-contributory     Past Surgical History:  Procedure Laterality Date  . NO PAST SURGERIES      Social History   Socioeconomic History  . Marital status: Single    Spouse name: Not on file  . Number of children: Not on file  . Years of education: Not on file  . Highest education level: Not on file  Occupational History  . Not on file  Social Needs  . Financial resource strain: Not on file  . Food insecurity:    Worry: Not on file    Inability: Not on file  . Transportation needs:    Medical: Not on file    Non-medical: Not on file  Tobacco Use  . Smoking status: Never Smoker  . Smokeless tobacco: Never Used  Substance and Sexual Activity  . Alcohol use: No  . Drug use: No  . Sexual activity: Yes  Lifestyle  . Physical activity:    Days per week: Not on file    Minutes per session: Not on file  . Stress: Not on file  Relationships  . Social connections:    Talks on phone: Not on file    Gets together:  Not on file    Attends religious service: Not on file    Active member of club or organization: Not on file    Attends meetings of clubs or organizations: Not on file    Relationship status: Not on file  . Intimate partner violence:    Fear of current or ex partner: Not on file    Emotionally abused: Not on file    Physically abused: Not on file    Forced sexual activity: Not on file  Other Topics Concern  . Not on file  Social History Narrative  . Not on file    Outpatient Encounter Medications as of 09/15/2018  Medication Sig  . FLUoxetine (PROZAC) 20 MG capsule Take 1 capsule (20 mg total) by mouth daily.  . medroxyPROGESTERone (DEPO-PROVERA) 150 MG/ML injection Inject 150 mg into the muscle every 3 (three) months.  . naproxen (NAPROSYN) 500 MG tablet Take 1 tablet (500 mg total) by mouth 2 (two) times daily with a meal.  . [DISCONTINUED] albuterol (PROVENTIL HFA;VENTOLIN HFA) 108 (90 Base) MCG/ACT inhaler Inhale 2 puffs into the lungs every 6 (six) hours as needed for wheezing or shortness of breath.  . [DISCONTINUED] benzonatate (TESSALON) 200 MG capsule Take 1 capsule (200 mg total) by mouth 3 (three) times daily as needed.  . [  DISCONTINUED] methocarbamol (ROBAXIN) 500 MG tablet Take 1 tablet (500 mg total) by mouth every 8 (eight) hours as needed for muscle spasms.   Facility-Administered Encounter Medications as of 09/15/2018  Medication  . medroxyPROGESTERone (DEPO-PROVERA) injection 150 mg    Allergies  Allergen Reactions  . Celebrex [Celecoxib] Hives  . Cymbalta [Duloxetine Hcl]     Severe Headache    Review of Systems  Constitutional: Negative for activity change, appetite change, chills, diaphoresis, fatigue, fever and unexpected weight change.  HENT: Negative for sore throat, trouble swallowing and voice change.   Eyes: Negative for photophobia and visual disturbance.  Respiratory: Negative for cough, chest tightness and shortness of breath.   Cardiovascular:  Negative for chest pain, palpitations and leg swelling.  Gastrointestinal: Negative for abdominal pain, constipation, diarrhea, nausea and vomiting.  Endocrine: Negative for cold intolerance and heat intolerance.  Genitourinary: Negative for menstrual problem.  Musculoskeletal: Positive for arthralgias. Negative for joint swelling and myalgias.  Neurological: Positive for tremors. Negative for dizziness, seizures, syncope, facial asymmetry, speech difficulty, weakness, light-headedness, numbness and headaches.  Psychiatric/Behavioral: Positive for agitation and dysphoric mood. Negative for confusion and sleep disturbance. The patient is not nervous/anxious.   All other systems reviewed and are negative.       Objective:  BP 131/81   Pulse (!) 104   Temp 98.8 F (37.1 C) (Oral)   Ht  (1.6 m)   Wt 125 lb (56.7 kg)   BMI 22.14 kg/m    Wt Readings from Last 3 Encounters:  09/15/18 125 lb (56.7 kg)  04/08/18 120 lb 9.6 oz (54.7 kg)  11/11/16 117 lb (53.1 kg)    Physical Exam Vitals signs and nursing note reviewed.  Constitutional:      General: She is not in acute distress.    Appearance: Normal appearance. She is well-developed, well-groomed and normal weight. She is not ill-appearing, toxic-appearing or diaphoretic.  HENT:     Head: Normocephalic and atraumatic.     Right Ear: Tympanic membrane, ear canal and external ear normal.     Left Ear: Tympanic membrane, ear canal and external ear normal.     Nose: Nose normal.     Mouth/Throat:     Mouth: Mucous membranes are moist.     Pharynx: Oropharynx is clear.  Eyes:     Extraocular Movements: Extraocular movements intact.     Conjunctiva/sclera: Conjunctivae normal.     Pupils: Pupils are equal, round, and reactive to light.  Neck:     Musculoskeletal: Neck supple.     Thyroid: Thyroid mass and thyroid tenderness present.     Vascular: No carotid bruit or JVD.     Trachea: Trachea and phonation normal.       Comments: Tender mass to left thyroid Cardiovascular:     Rate and Rhythm: Regular rhythm. Tachycardia present.     Pulses: Normal pulses.     Heart sounds: No murmur. No friction rub. No gallop.   Pulmonary:     Effort: Pulmonary effort is normal. No respiratory distress.     Breath sounds: Normal breath sounds.  Musculoskeletal: Normal range of motion.  Skin:    General: Skin is warm and dry.     Capillary Refill: Capillary refill takes less than 2 seconds.  Neurological:     General: No focal deficit present.     Mental Status: She is alert and oriented to person, place, and time.  Psychiatric:        Mood  and Affect: Mood normal.        Behavior: Behavior normal. Behavior is cooperative.        Thought Content: Thought content normal.        Judgment: Judgment normal.     Results for orders placed or performed in visit on 11/11/17  Pregnancy, urine  Result Value Ref Range   Preg Test, Ur Negative Negative       Pertinent labs & imaging results that were available during my care of the patient were reviewed by me and considered in my medical decision making.  Assessment & Plan:  Theresa Watts was seen today for lump in neck.  Diagnoses and all orders for this visit:  Thyroid nodule Tender left thyroid nodule. Will get stat US of thyroid and labs. Will initiate NSAIDs today. Labs and [place] pending. Will devise treatment plan once these have resulted. Pt aware to report any new or worsening symptoms. Referral to endocrinology once labs and [place] have resulted.  -     Thyroid Panel With TSH -     US THYROID; Future -     naproxen (NAPROSYN) 500 MG tablet; Take 1 tablet (500 mg total) by mouth 2 (two) times daily with a meal. -     Thyroid antibodies     Continue all other maintenance medications.  Follow up plan: Return in about 1 week (around 09/22/2018), or if symptoms worsen or fail to improve, for thyroid.  Educational handout given for thyroid nodule  The above assessment  and management plan was discussed with the patient. The patient verbalized understanding of and has agreed to the management plan. Patient is aware to call the clinic if symptoms persist or worsen. Patient is aware when to return to the clinic for a follow-up visit. Patient educated on when it is appropriate to go to the emergency department.   Kari Baars, FNP-C [facility] [phone]

## 2018-09-16 ENCOUNTER — Telehealth: Payer: Self-pay | Admitting: Physician Assistant

## 2018-09-16 LAB — THYROID PANEL WITH TSH
Free Thyroxine Index: 1.7 (ref 1.2–4.9)
T3 Uptake Ratio: 28 % (ref 24–39)
T4, Total: 6 ug/dL (ref 4.5–12.0)
TSH: 0.595 u[IU]/mL (ref 0.450–4.500)

## 2018-09-16 LAB — THYROID ANTIBODIES
Thyroglobulin Antibody: <1 IU/mL (ref 0.0–0.9)
Thyroperoxidase Ab SerPl-aCnc: 10 IU/mL (ref 0–34)

## 2018-09-16 NOTE — Telephone Encounter (Signed)
They are not all back. I will let her know as soon as I have everything.

## 2018-09-16 NOTE — Addendum Note (Signed)
Addended by: Sonny Masters on: 09/16/2018 02:17 PM   Modules accepted: Orders

## 2018-09-16 NOTE — Telephone Encounter (Signed)
Aware some labs still pending.

## 2018-09-20 ENCOUNTER — Ambulatory Visit: Payer: Managed Care, Other (non HMO) | Admitting: Physician Assistant

## 2018-09-22 ENCOUNTER — Other Ambulatory Visit: Payer: Self-pay

## 2018-09-22 ENCOUNTER — Telehealth: Payer: Self-pay | Admitting: Physician Assistant

## 2018-09-22 ENCOUNTER — Ambulatory Visit (INDEPENDENT_AMBULATORY_CARE_PROVIDER_SITE_OTHER): Payer: Managed Care, Other (non HMO) | Admitting: Family Medicine

## 2018-09-22 ENCOUNTER — Encounter (HOSPITAL_COMMUNITY): Payer: Self-pay

## 2018-09-22 ENCOUNTER — Other Ambulatory Visit: Payer: Self-pay | Admitting: Family Medicine

## 2018-09-22 ENCOUNTER — Encounter: Payer: Self-pay | Admitting: Family Medicine

## 2018-09-22 ENCOUNTER — Ambulatory Visit (HOSPITAL_COMMUNITY)
Admission: RE | Admit: 2018-09-22 | Discharge: 2018-09-22 | Disposition: A | Discharge status: Home / Self Care | Payer: Managed Care, Other (non HMO) | Source: Ambulatory Visit | Attending: Family Medicine | Admitting: Family Medicine

## 2018-09-22 ENCOUNTER — Encounter (HOSPITAL_COMMUNITY)
Admission: RE | Admit: 2018-09-22 | Discharge: 2018-09-22 | Disposition: A | Discharge status: Home / Self Care | Payer: Managed Care, Other (non HMO) | Source: Ambulatory Visit | Attending: Family Medicine | Admitting: Family Medicine

## 2018-09-22 DIAGNOSIS — E042 Nontoxic multinodular goiter: Secondary | ICD-10-CM | POA: Insufficient documentation

## 2018-09-22 DIAGNOSIS — F411 Generalized anxiety disorder: Secondary | ICD-10-CM | POA: Diagnosis not present

## 2018-09-22 DIAGNOSIS — F321 Major depressive disorder, single episode, moderate: Secondary | ICD-10-CM

## 2018-09-22 DIAGNOSIS — E041 Nontoxic single thyroid nodule: Secondary | ICD-10-CM

## 2018-09-22 DIAGNOSIS — G4701 Insomnia due to medical condition: Secondary | ICD-10-CM

## 2018-09-22 MED ORDER — TRAZODONE HCL 50 MG PO TABS: 25.0000 mg | ORAL_TABLET | Freq: Every evening | ORAL | 3 refills | Status: DC | PRN

## 2018-09-22 MED ORDER — FLUOXETINE HCL 40 MG PO CAPS: 40.0000 mg | ORAL_CAPSULE | Freq: Every day | ORAL | 3 refills | Status: DC

## 2018-09-22 MED ORDER — SODIUM IODIDE I-123 7.4 MBQ CAPS
300.0000 | ORAL_CAPSULE | Freq: Once | ORAL | Status: AC
  Administered 2018-09-22: 300 via ORAL

## 2018-09-22 NOTE — Telephone Encounter (Signed)
LMTCB

## 2018-09-22 NOTE — Progress Notes (Signed)
Virtual Visit via telephone Note Due to COVID-19, visit is conducted virtually and was requested by patient. This visit type was conducted due to national recommendations for restrictions regarding the COVID-19 Pandemic (e.g. social distancing) in an effort to limit this patient's exposure and mitigate transmission in our community. All issues noted in this document were discussed and addressed.  A physical exam was not performed with this format.   I connected with Theresa PoundBrittany N Obryant on 09/22/18 at 0815 by telephone and verified that I am speaking with the correct person using two identifiers. Theresa PoundBrittany N Golubski is currently located at home and no one is currently with them during visit. The provider, Kari BaarsMichelle , FNP is located in their office at time of visit.  I discussed the limitations, risks, security and privacy concerns of performing an evaluation and management service by telephone and the availability of in person appointments. I also discussed with the patient that there may be a patient responsible charge related to this service. The patient expressed understanding and agreed to proceed.  Subjective:  Patient ID: Theresa Watts, female    DOB: 1990/09/27, 28 y.o.   MRN: [id number]  Chief Complaint:  Thyroid Problem   HPI: Theresa PoundBrittany N Czerwinski is a 28 y.o. female presenting on 09/22/2018 for Thyroid Problem   Pt following up for thyroid nodule. Pt states she has not been taking the naproxen. States the nodule has not increased in size. She does complain of insomnia and increasing anxiety and depression. No constipation, diarrhea, palpitations, shortness of breath, heat or cold intolerance, or weight changes. No night sweats or fatigue. She does have trouble sleeping due to her ongoing anxiety. States she is unable to shut her thoughts off at night and can not go to sleep for several hours after laying down.  Thyroid Problem  Presents for follow-up visit. Symptoms include  anxiety and depressed mood. Patient reports no cold intolerance, constipation, diaphoresis, diarrhea, dry skin, fatigue, hair loss, heat intolerance, hoarse voice, leg swelling, menstrual problem, nail problem, palpitations, tremors, visual change, weight gain or weight loss. The symptoms have been stable.   GAD 7 : Generalized Anxiety Score 09/22/2018 08/29/2018  Nervous, Anxious, on Edge 2 3  Control/stop worrying 2 3  Worry too much - different things 2 3  Trouble relaxing 2 2  Restless 0 2  Easily annoyed or irritable 2 3  Afraid - awful might happen 2 1  Total GAD 7 Score 12 17  Anxiety Difficulty Somewhat difficult Very difficult    [facility] 09/22/2018 08/29/2018 11/11/2016 08/12/2016 07/10/2016  Decreased Interest 2 2 0 0 0  Down, Depressed, Hopeless 2 3 0 0 0  PHQ - 2 Score 4 5 0 0 0  Altered sleeping 3 1 - - -  Tired, decreased energy 2 3 - - -  Change in appetite 0 0 - - -  Feeling bad or failure about yourself  1 2 - - -  Trouble concentrating 1 2 - - -  Moving slowly or fidgety/restless 0 1 - - -  Suicidal thoughts 0 0 - - -  PHQ-9 Score 11 14 - - -  Difficult doing work/chores - Very difficult - - -      Relevant past medical, surgical, family, and social history reviewed and updated as indicated.  Allergies and medications reviewed and updated.   Past Medical History:  Diagnosis Date  . Medical history non-contributory     Past Surgical History:  Procedure  Laterality Date  . NO PAST SURGERIES      Social History   Socioeconomic History  . Marital status: Single    Spouse name: Not on file  . Number of children: Not on file  . Years of education: Not on file  . Highest education level: Not on file  Occupational History  . Not on file  Social Needs  . Financial resource strain: Not on file  . Food insecurity:    Worry: Not on file    Inability: Not on file  . Transportation needs:    Medical: Not on file    Non-medical: Not on file   Tobacco Use  . Smoking status: Never Smoker  . Smokeless tobacco: Never Used  Substance and Sexual Activity  . Alcohol use: No  . Drug use: No  . Sexual activity: Yes  Lifestyle  . Physical activity:    Days per week: Not on file    Minutes per session: Not on file  . Stress: Not on file  Relationships  . Social connections:    Talks on phone: Not on file    Gets together: Not on file    Attends religious service: Not on file    Active member of club or organization: Not on file    Attends meetings of clubs or organizations: Not on file    Relationship status: Not on file  . Intimate partner violence:    Fear of current or ex partner: Not on file    Emotionally abused: Not on file    Physically abused: Not on file    Forced sexual activity: Not on file  Other Topics Concern  . Not on file  Social History Narrative  . Not on file    Outpatient Encounter Medications as of 09/22/2018  Medication Sig  . FLUoxetine (PROZAC) 40 MG capsule Take 1 capsule (40 mg total) by mouth daily.  . medroxyPROGESTERone (DEPO-PROVERA) 150 MG/ML injection Inject 150 mg into the muscle every 3 (three) months.  . naproxen (NAPROSYN) 500 MG tablet Take 1 tablet (500 mg total) by mouth 2 (two) times daily with a meal.  . traZODone (DESYREL) 50 MG tablet Take 0.5 tablets (25 mg total) by mouth at bedtime as needed for sleep.  . [DISCONTINUED] FLUoxetine (PROZAC) 20 MG capsule Take 1 capsule (20 mg total) by mouth daily.   Facility-Administered Encounter Medications as of 09/22/2018  Medication  . medroxyPROGESTERone (DEPO-PROVERA) injection 150 mg    Allergies  Allergen Reactions  . Celebrex [Celecoxib] Hives  . Cymbalta [Duloxetine Hcl]     Severe Headache    Review of Systems  Constitutional: Negative for activity change, appetite change, chills, diaphoresis, fatigue, fever, unexpected weight change, weight gain and weight loss.  HENT: Negative for hoarse voice.   Respiratory: Negative for  cough and shortness of breath.   Cardiovascular: Negative for chest pain, palpitations and leg swelling.  Gastrointestinal: Negative for constipation and diarrhea.  Endocrine: Negative for cold intolerance and heat intolerance.  Genitourinary: Negative for menstrual problem.  Musculoskeletal: Negative for arthralgias and myalgias.  Skin: Negative for color change, pallor and rash.  Neurological: Negative for dizziness, tremors, weakness, light-headedness and headaches.  Hematological: Does not bruise/bleed easily.  Psychiatric/Behavioral: Positive for agitation, decreased concentration, dysphoric mood and sleep disturbance. Negative for behavioral problems, confusion, hallucinations, self-injury and suicidal ideas. The patient is nervous/anxious. The patient is not hyperactive.   All other systems reviewed and are negative.  Observations/Objective: No vital signs or physical exam, this was a telephone or virtual health encounter.  Pt alert and oriented, answers all questions appropriately, and able to speak in full sentences.    Assessment and Plan: [name] was seen today for thyroid problem.  Diagnoses and all orders for this visit:  Multinodular goiter  Thyroid nodule Pt has nuclear medicine scan scheduled today and endocrinology appointment next week. Has not been taking the Naproxen, states nodule has not enlarged, states maybe decreased in size. Awaiting scan results.   GAD (generalized anxiety disorder) Depression, major, single episode, moderate (HCC) Due to ongoing depression and anxiety, will increase Fluoxetine to 40 mg daily. Report any new or worsening symptoms. Follow up in 6 weeks or sooner if symptoms worsen.  -     FLUoxetine (PROZAC) 40 MG capsule; Take 1 capsule (40 mg total) by mouth daily.  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 Will trial trazodone 25 mg nightly. Sleep hygiene discussed. Avoid stimulants prior to  bedtime.  -     traZODone (DESYREL) 50 MG tablet; Take 0.5 tablets (25 mg total) by mouth at bedtime as needed for sleep.     Follow Up Instructions: Return in about 6 weeks (around 11/03/2018) for thyroid.    I discussed the assessment and treatment plan with the patient. The patient was provided an opportunity to ask questions and all were answered. The patient agreed with the plan and demonstrated an understanding of the instructions.   The patient was advised to call back or seek an in-person evaluation if the symptoms worsen or if the condition fails to improve as anticipated.  The above assessment and management plan was discussed with the patient. The patient verbalized understanding of and has agreed to the management plan. Patient is aware to call the clinic if symptoms persist or worsen. Patient is aware when to return to the clinic for a follow-up visit. Patient educated on when it is appropriate to go to the emergency department.    I provided 15 minutes of non-face-to-face time during this encounter. The call started at 0815. The call ended at 0830. The other time was used for coordination of care.    Kari Baars, FNP-C [facility] [address] [place], [place] [address] [phone]

## 2018-09-23 ENCOUNTER — Ambulatory Visit (HOSPITAL_COMMUNITY): Payer: Managed Care, Other (non HMO)

## 2018-09-27 ENCOUNTER — Encounter: Payer: Self-pay | Admitting: "Endocrinology

## 2018-09-27 ENCOUNTER — Other Ambulatory Visit: Payer: Self-pay

## 2018-09-27 ENCOUNTER — Ambulatory Visit (INDEPENDENT_AMBULATORY_CARE_PROVIDER_SITE_OTHER): Payer: Managed Care, Other (non HMO) | Admitting: "Endocrinology

## 2018-09-27 VITALS — BP 97/65 | HR 90 | Ht 63.0 in | Wt 126.0 lb

## 2018-09-27 DIAGNOSIS — E042 Nontoxic multinodular goiter: Secondary | ICD-10-CM | POA: Diagnosis not present

## 2018-09-27 NOTE — Progress Notes (Signed)
Endocrinology Consult Note                                            09/27/2018, 1:06 PM   Subjective:    Patient ID: Theresa Watts, female    DOB: 1990/09/10, PCP Terald Sleeper, PA-C   Past Medical History:  Diagnosis Date  . Medical history non-contributory    Past Surgical History:  Procedure Laterality Date  . NO PAST SURGERIES     Social History   Socioeconomic History  . Marital status: Single    Spouse name: Not on file  . Number of children: Not on file  . Years of education: Not on file  . Highest education level: Not on file  Occupational History  . Not on file  Social Needs  . Financial resource strain: Not on file  . Food insecurity:    Worry: Not on file    Inability: Not on file  . Transportation needs:    Medical: Not on file    Non-medical: Not on file  Tobacco Use  . Smoking status: Never Smoker  . Smokeless tobacco: Never Used  Substance and Sexual Activity  . Alcohol use: No  . Drug use: No  . Sexual activity: Yes  Lifestyle  . Physical activity:    Days per week: Not on file    Minutes per session: Not on file  . Stress: Not on file  Relationships  . Social connections:    Talks on phone: Not on file    Gets together: Not on file    Attends religious service: Not on file    Active member of club or organization: Not on file    Attends meetings of clubs or organizations: Not on file    Relationship status: Not on file  Other Topics Concern  . Not on file  Social History Narrative  . Not on file   History reviewed. No pertinent family history. Outpatient Encounter Medications as of 09/27/2018  Medication Sig  . FLUoxetine (PROZAC) 40 MG capsule Take 1 capsule (40 mg total) by mouth daily.  . medroxyPROGESTERone (DEPO-PROVERA) 150 MG/ML injection Inject 150 mg into the muscle every 3 (three) months.  . traZODone (DESYREL) 50 MG tablet Take 0.5 tablets (25 mg total) by mouth at bedtime as needed for sleep.  .  [DISCONTINUED] naproxen (NAPROSYN) 500 MG tablet Take 1 tablet (500 mg total) by mouth 2 (two) times daily with a meal.   Facility-Administered Encounter Medications as of 09/27/2018  Medication  . medroxyPROGESTERone (DEPO-PROVERA) injection 150 mg   ALLERGIES: Allergies  Allergen Reactions  . Celebrex [Celecoxib] Hives  . Cymbalta [Duloxetine Hcl]     Severe Headache    VACCINATION STATUS: Immunization History  Administered Date(s) Administered  . Hepatitis B 01/20/2001, 02/24/2001, 07/07/2001    HPI Theresa Watts is 28 y.o. female who presents today with a medical history as above. she is being seen in consultation for multinodular goiter requested by Terald Sleeper, PA-C.  She denies any prior history of thyroid dysfunction nor family history of it. She was found to have clinical goiter at the beginning of May 2020 for which she underwent thyroid ultrasound on Sep 15, 2018 which showed 4.6 cm right lobe with 3 nodules largest being 1.9 cm, 4.2 cm left lobe with 3 nodules largest being  2.4 cm.  An attempt was made to do thyroid uptake and scan, however this test was incomplete due to the fact that she spit out the radioactive iodine.  She denies dysphagia, shortness of breath, voice change.  Her thyroid function tests on the same day of Sep 15, 2018 was within normal limits.  She denies heat intolerance, palpitations, nor tremors.  Review of Systems  Constitutional: + Slow progressive weight gain, still with BMI of 22.  no fatigue, no subjective hyperthermia, no subjective hypothermia Eyes: no blurry vision, no xerophthalmia ENT: no sore throat, no nodules palpated in throat, no dysphagia/odynophagia, no hoarseness Cardiovascular: no Chest Pain, no Shortness of Breath, no palpitations, no leg swelling Respiratory: no cough, no shortness of breath Gastrointestinal: no Nausea/Vomiting/Diarhhea Musculoskeletal: no muscle/joint aches Skin: no rashes Neurological: no tremors,  no numbness, no tingling, no dizziness Psychiatric: no depression, no anxiety  Objective:    BP 97/65   Pulse 90   Ht 5\' 3"  (1.6 m)   Wt 126 lb (57.2 kg)   BMI 22.32 kg/m   Wt Readings from Last 3 Encounters:  09/27/18 126 lb (57.2 kg)  09/15/18 125 lb (56.7 kg)  04/08/18 120 lb 9.6 oz (54.7 kg)    Physical Exam  Constitutional: + Appropriate weight for height, not in acute distress, normal state of mind Eyes: PERRLA, EOMI, no exophthalmos ENT: moist mucous membranes, + gross thyromegaly, no gross cervical lymphadenopathy Cardiovascular: normal precordial activity, Regular Rate and Rhythm, no Murmur/Rubs/Gallops Respiratory:  adequate breathing efforts, no gross chest deformity, Clear to auscultation bilaterally Gastrointestinal: abdomen soft, Non -tender, No distension, Bowel Sounds present, no gross organomegaly Musculoskeletal: no gross deformities, strength intact in all four extremities Skin: moist, warm, no rashes Neurological: no tremor with outstretched hands, Deep tendon reflexes normal in bilateral lower extremities.  CMP ( most recent) CMP     Component Value Date/Time   NA 142 06/08/2016 1615   K 4.2 06/08/2016 1615   CL 106 06/08/2016 1615   CO2 20 06/08/2016 1615   GLUCOSE 95 06/08/2016 1615   GLUCOSE 83 10/01/2015 1244   BUN 8 06/08/2016 1615   CREATININE 0.93 06/08/2016 1615   CALCIUM 8.9 06/08/2016 1615   PROT 7.3 06/08/2016 1615   ALBUMIN 4.4 06/08/2016 1615   AST 16 06/08/2016 1615   ALT 15 06/08/2016 1615   ALKPHOS 61 06/08/2016 1615   BILITOT 0.8 06/08/2016 1615   GFRNONAA 84 06/08/2016 1615   GFRAA 97 06/08/2016 1615       Lab Results  Component Value Date   TSH 0.595 09/15/2018   TSH 0.475 06/08/2016    Sep 15, 2018 thyroid ultrasound: Right lobe 4.6 cm with 3 nodules 1.9 cm, 1.2 cm, and 0.8 cm.  Left lobe measuring 4.2 cm with 3 nodules measuring 2.4 cm, 1.5 cm and 1.6 cm.  These nodules were described as smooth bordered, cystic to  solid in consistency.   Assessment & Plan:   1. Multinodular goiter  - Theresa Watts  is being seen at a kind request of Remus LofflerJones, Angel S, PA-C. - I have reviewed her available thyroid records and clinically evaluated the patient. - Based on these reviews, she has euthyroid multinodular goiter,  however,  there is not sufficient information to proceed with definitive treatment plan.  - she will need a repeat attempt to do thyroid uptake and scan.  If she is found to have cold nodules, she will be considered for fine-needle aspiration.  -she will return  in 2 week to review her repeat labs. -Her recent thyroid function tests were within normal limits, I did not initiate any new prescriptions today. - I advised her  to maintain close follow up with Remus LofflerJones, Angel S, PA-C for primary care needs.   - Time spent with the patient: 45 minutes, of which >50% was spent in obtaining information about her symptoms, reviewing her previous labs/studies,  evaluations, and treatments, counseling her about her euthyroid multinodular goiter, and developing a plan to confirm the diagnosis and long term treatment based on the latest standards of care/guidelines.    Theresa Watts participated in the discussions, expressed understanding, and voiced agreement with the above plans.  All questions were answered to her satisfaction. she is encouraged to contact clinic should she have any questions or concerns prior to her return visit.  Follow up plan: Return in about 2 weeks (around 10/11/2018) for Follow up with Thyroid Uptake and Scan.   Marquis LunchGebre Nuria Phebus, MD [facility] [address] [place], [place] [address] Phone: [phone]  Fax: [phone]     09/27/2018, 1:06 PM  This note was partially dictated with voice recognition software. Similar sounding words can be transcribed inadequately or may not  be corrected upon review.

## 2018-10-13 ENCOUNTER — Ambulatory Visit: Payer: Managed Care, Other (non HMO) | Admitting: "Endocrinology

## 2018-10-17 ENCOUNTER — Encounter (HOSPITAL_COMMUNITY)
Admission: RE | Admit: 2018-10-17 | Discharge: 2018-10-17 | Disposition: A | Discharge status: Home / Self Care | Payer: Managed Care, Other (non HMO) | Source: Ambulatory Visit | Attending: "Endocrinology | Admitting: "Endocrinology

## 2018-10-17 ENCOUNTER — Other Ambulatory Visit: Payer: Self-pay

## 2018-10-17 DIAGNOSIS — E042 Nontoxic multinodular goiter: Secondary | ICD-10-CM | POA: Diagnosis not present

## 2018-10-17 MED ORDER — SODIUM IODIDE I-123 7.4 MBQ CAPS
301.0000 | ORAL_CAPSULE | Freq: Once | ORAL | Status: AC
  Administered 2018-10-17: 301 via ORAL

## 2018-10-18 ENCOUNTER — Encounter (HOSPITAL_COMMUNITY)
Admission: RE | Admit: 2018-10-18 | Discharge: 2018-10-18 | Disposition: A | Discharge status: Home / Self Care | Payer: Managed Care, Other (non HMO) | Source: Ambulatory Visit | Attending: "Endocrinology | Admitting: "Endocrinology

## 2018-10-31 ENCOUNTER — Ambulatory Visit (INDEPENDENT_AMBULATORY_CARE_PROVIDER_SITE_OTHER): Payer: Managed Care, Other (non HMO) | Admitting: Family Medicine

## 2018-10-31 ENCOUNTER — Encounter: Payer: Self-pay | Admitting: Family Medicine

## 2018-10-31 ENCOUNTER — Other Ambulatory Visit: Payer: Self-pay

## 2018-10-31 VITALS — BP 120/75 | HR 99 | Temp 99.1°F | Ht 63.0 in | Wt 127.0 lb

## 2018-10-31 DIAGNOSIS — M7062 Trochanteric bursitis, left hip: Secondary | ICD-10-CM

## 2018-10-31 MED ORDER — METHYLPREDNISOLONE ACETATE 40 MG/ML IJ SUSP
40.0000 mg | Freq: Once | INTRAMUSCULAR | Status: AC
  Administered 2018-10-31: 40 mg via INTRA_ARTICULAR

## 2018-10-31 MED ORDER — MELOXICAM 15 MG PO TABS: 7.5000 mg | ORAL_TABLET | Freq: Every day | ORAL | 2 refills | Status: DC

## 2018-10-31 NOTE — Addendum Note (Signed)
Addended byCarrolyn Leigh on: 10/31/2018 10:51 AM   Modules accepted: Orders

## 2018-10-31 NOTE — Patient Instructions (Signed)
Hip Bursitis  Hip bursitis is inflammation of a fluid-filled sac (bursa) in the hip joint. The bursa prevents the bones in the hip joint from rubbing against each other. Hip bursitis can cause mild to moderate pain, and symptoms often come and go over time. What are the causes? This condition may be caused by:  Injury to the hip.  Overuse of the muscles that surround the hip joint.  Previous injury or surgery of the hip.  Arthritis or gout.  Diabetes.  Thyroid disease.  Infection. In some cases, the cause may not be known. What are the signs or symptoms? Symptoms of this condition include:  Mild or moderate pain in the hip area. Pain may get worse with movement.  Tenderness and swelling of the hip, especially on the outer side of the hip.  In rare cases, the bursa may become infected. This may cause a fever, as well as warmth and redness in the area. Symptoms may come and go. How is this diagnosed? This condition may be diagnosed based on:  A physical exam.  Your medical history.  X-rays.  Removal of fluid from your inflamed bursa for testing (biopsy). You may be sent to a health care provider who specializes in bone diseases (orthopedist) or a provider who specializes in joint inflammation (rheumatologist). How is this treated? This condition is treated by resting, icing, applying pressure (compression), and raising (elevating) the injured area. This is called RICE treatment. In some cases, this may be enough to make your symptoms go away. Treatment may also include:  Using crutches.  Draining fluid out of the bursa to help relieve swelling.  Injecting medicine that helps to reduce inflammation (cortisone).  Additional medicines if the bursa is infected. Follow these instructions at home: Managing pain, stiffness, and swelling   If directed, put ice on the painful area. ? Put ice in a plastic bag. ? Place a towel between your skin and the bag. ? Leave the ice  on for 20 minutes, 2-3 times a day. ? Raise (elevate) your hip above the level of your heart as much as you can without pain. To do this, try putting a pillow under your hips while you lie down. Activity  Return to your normal activities as told by your health care provider. Ask your health care provider what activities are safe for you.  Rest and protect your hip as much as possible until your pain and swelling get better. General instructions  Take over-the-counter and prescription medicines only as told by your health care provider.  Wear compression wraps only as told by your health care provider.  Do not use your hip to support your body weight until your health care provider says that you can. Use crutches as told by your health care provider.  Gently massage and stretch your injured area as often as is comfortable.  Keep all follow-up visits as told by your health care provider. This is important. How is this prevented?  Exercise regularly, as told by your health care provider.  Warm up and stretch before being active.  Cool down and stretch after being active.  If an activity irritates your hip or causes pain, avoid the activity as much as possible.  Avoid sitting down for long periods at a time. Contact a health care provider if you:  Have a fever.  Develop new symptoms.  Have difficulty walking or doing everyday activities.  Have pain that gets worse or does not get better with medicine.    Develop red skin or a feeling of warmth in your hip area. Get help right away if you:  Cannot move your hip.  Have severe pain. Summary  Hip bursitis is inflammation of a fluid-filled sac (bursa) in the hip joint.  Hip bursitis can cause mild to moderate pain, and symptoms often come and go over time.  This condition is treated with rest, ice, compression, elevation, and medicines. This information is not intended to replace advice given to you by your health care  provider. Make sure you discuss any questions you have with your health care provider. Document Released: 09/26/2001 Document Revised: 12/13/2017 Document Reviewed: 12/13/2017 Elsevier Patient Education  2020 [employer]  

## 2018-10-31 NOTE — Progress Notes (Signed)
Subjective: CC: Hip pain PCP: Terald Sleeper, PA-C YTK:[id number] Theresa Watts is a 28 y.o. female presenting to clinic today for:  1.  Hip pain Patient reports several month history of left-sided hip pain.  She notes that it is worse when she rolls on or lays on that side.  She reports that the pain is intermittent but lasts quite a bit.  It is refractory to Tylenol and ibuprofen use.  She is been given prednisone in the past for her back pain but this is not seem to help with the hip pain.  She does feel that the left side appears to be more swollen than the right at times and that the bone seems to poke out on the left at times.  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   ROS: Per HPI  Allergies  Allergen Reactions  . Celebrex [Celecoxib] Hives  . Cymbalta [Duloxetine Hcl]     Severe Headache   Past Medical History:  Diagnosis Date  . Medical history non-contributory     Current Outpatient Medications:  .  FLUoxetine (PROZAC) 40 MG capsule, Take 1 capsule (40 mg total) by mouth daily., Disp: 30 capsule, Rfl: 3 .  medroxyPROGESTERone (DEPO-PROVERA) 150 MG/ML injection, Inject 150 mg into the muscle every 3 (three) months., Disp: , Rfl:  .  traZODone (DESYREL) 50 MG tablet, Take 0.5 tablets (25 mg total) by mouth at bedtime as needed for sleep., Disp: 30 tablet, Rfl: 3  Current Facility-Administered Medications:  .  medroxyPROGESTERone (DEPO-PROVERA) injection 150 mg, 150 mg, Intramuscular, Q90 days, Jones, Angel S, PA-C, 150 mg at 08/05/18 1145 Social History   Socioeconomic History  . Marital status: Single    Spouse name: Not on file  . Number of children: Not on file  . Years of education: Not on file  . Highest education level: Not on file  Occupational History  . Not on file  Social Needs  . Financial resource strain: Not on file  . Food insecurity    Worry: Not on file    Inability: Not on file  .  Transportation needs    Medical: Not on file    Non-medical: Not on file  Tobacco Use  . Smoking status: Never Smoker  . Smokeless tobacco: Never Used  Substance and Sexual Activity  . Alcohol use: No  . Drug use: No  . Sexual activity: Yes  Lifestyle  . Physical activity    Days per week: Not on file    Minutes per session: Not on file  . Stress: Not on file  Relationships  . Social Herbalist on phone: Not on file    Gets together: Not on file    Attends religious service: Not on file    Active member of club or organization: Not on file    Attends meetings of clubs or organizations: Not on file    Relationship status: Not on file  . Intimate partner violence    Fear of current or ex partner: Not on file    Emotionally abused: Not on file    Physically abused: Not on file    Forced sexual activity: Not on file  Other Topics Concern  . Not on file  Social History Narrative  . Not on file   No family history on file.  Objective: Office vital signs reviewed. BP 120/75   Pulse 99  Temp 99.1 F (37.3 C) (Oral)   Ht 5\' 3"  (1.6 m)   Wt 127 lb (57.6 kg)   BMI 22.50 kg/m   Physical Examination:  General: Awake, alert, well nourished, No acute distress Extremities: warm, well perfused, No edema, cyanosis or clubbing; +2 pulses bilaterally MSK: Normal gait and station  Left hip: Exquisite tenderness palpation over the trochanteric bursa.  She has aggravation with FADIR and FABER.  Right hip: Mildly tender to palpation of the trochanteric bursa. Skin: dry; intact; no rashes or lesions Neuro: 5/5 LE Strength and light touch sensation grossly intact  BURSA INJECTION:  Patient denies allergy to antiseptics (including iodine) and anesthetics.  Patient denies h/o diabetes, frequent steroid use, use of blood thinners/ antiplatelets.  Patient was given informed consent and a signed copy has been placed in the chart. Appropriate time out was taken. Area prepped and  draped in usual sterile fashion. Anatomic landmarks were identified and injection site was marked.  Ethyl chloride spray was used to numb the area and 1 cc of methylprednisolone 40 mg/ml plus  3 cc of 2% lidocaine without epinephrine was injected into the left trochanteric bursa. The patient tolerated the procedure well and there were no immediate complications. Estimated blood loss is less than 1 cc.  Post procedure instructions were reviewed and handout outlining these instructions were provided to patient.   Assessment/ Plan: 28 y.o. female   1. Trochanteric bursitis of left hip Treated with corticosteroid injection to the bursa.  She was also given a supply of meloxicam to have on hand.  Avoid other NSAIDs.  Home care instructions reviewed and reasons for return discussed.  She will follow-up PRN. - meloxicam (MOBIC) 15 MG tablet; Take 0.5-1 tablets (7.5-15 mg total) by mouth daily. (if needed for pain. Take separately from Prozac)  Dispense: 30 tablet; Refill: 2   No orders of the defined types were placed in this encounter.  No orders of the defined types were placed in this encounter.    Raliegh IpAshly M Gottschalk, DO [facility] [phone]

## 2018-11-01 DIAGNOSIS — Z3042 Encounter for surveillance of injectable contraceptive: Secondary | ICD-10-CM | POA: Diagnosis not present

## 2018-11-02 ENCOUNTER — Telehealth: Payer: Self-pay | Admitting: Family Medicine

## 2018-11-02 NOTE — Telephone Encounter (Signed)
Pt notified work note is ready for pick up

## 2018-11-02 NOTE — Telephone Encounter (Signed)
Ok to provide

## 2018-11-16 ENCOUNTER — Ambulatory Visit (INDEPENDENT_AMBULATORY_CARE_PROVIDER_SITE_OTHER): Payer: Managed Care, Other (non HMO) | Admitting: "Endocrinology

## 2018-11-16 ENCOUNTER — Other Ambulatory Visit: Payer: Self-pay

## 2018-11-16 ENCOUNTER — Encounter: Payer: Self-pay | Admitting: "Endocrinology

## 2018-11-16 DIAGNOSIS — E042 Nontoxic multinodular goiter: Secondary | ICD-10-CM

## 2018-11-16 NOTE — Progress Notes (Signed)
11/16/2018, 4:30 PM                                Endocrinology Telehealth Visit Follow up Note -During COVID -19 Pandemic  I connected with Theresa Watts on 11/16/2018   by telephone and verified that I am speaking with the correct person using two identifiers. Theresa Watts, 08/18/1990. she has verbally consented to this visit. All issues noted in this document were discussed and addressed. The format was not optimal for physical exam.   Subjective:    Patient ID: Theresa Watts, female    DOB: 08/18/1990, PCP Remus LofflerJones, Angel S, PA-C   Past Medical History:  Diagnosis Date  . Medical history non-contributory    Past Surgical History:  Procedure Laterality Date  . NO PAST SURGERIES     Social History   Socioeconomic History  . Marital status: Single    Spouse name: Not on file  . Number of children: Not on file  . Years of education: Not on file  . Highest education level: Not on file  Occupational History  . Not on file  Social Needs  . Financial resource strain: Not on file  . Food insecurity    Worry: Not on file    Inability: Not on file  . Transportation needs    Medical: Not on file    Non-medical: Not on file  Tobacco Use  . Smoking status: Never Smoker  . Smokeless tobacco: Never Used  Substance and Sexual Activity  . Alcohol use: No  . Drug use: No  . Sexual activity: Yes  Lifestyle  . Physical activity    Days per week: Not on file    Minutes per session: Not on file  . Stress: Not on file  Relationships  . Social Musicianconnections    Talks on phone: Not on file    Gets together: Not on file    Attends religious service: Not on file    Active member of club or organization: Not on file    Attends meetings of clubs or organizations: Not on file    Relationship status: Not on file  Other Topics Concern  . Not on file  Social History Narrative  . Not on file   History reviewed. No  pertinent family history. Outpatient Encounter Medications as of 11/16/2018  Medication Sig  . FLUoxetine (PROZAC) 40 MG capsule Take 1 capsule (40 mg total) by mouth daily.  . medroxyPROGESTERone (DEPO-PROVERA) 150 MG/ML injection Inject 150 mg into the muscle every 3 (three) months.  . meloxicam (MOBIC) 15 MG tablet Take 0.5-1 tablets (7.5-15 mg total) by mouth daily. (if needed for pain. Take separately from Prozac)  . traZODone (DESYREL) 50 MG tablet Take 0.5 tablets (25 mg total) by mouth at bedtime as needed for sleep.   Facility-Administered Encounter Medications as of 11/16/2018  Medication  . medroxyPROGESTERone (DEPO-PROVERA) injection 150 mg   ALLERGIES: Allergies  Allergen Reactions  . Celebrex [Celecoxib] Hives  . Cymbalta [Duloxetine Hcl]     Severe Headache    VACCINATION STATUS: Immunization History  Administered Date(s) Administered  . Hepatitis B 01/20/2001, 02/24/2001, 07/07/2001  HPI Theresa Watts is 28 y.o. female who presents today with a medical history as above. she is being being engaged in telehealth via telephone in follow-up for multinodular goiter with thyroid uptake and scan.   She denies any prior history of thyroid dysfunction nor family history of it. She was found to have clinical goiter at the beginning of May 2020 for which she underwent thyroid ultrasound on Sep 15, 2018 which showed 4.6 cm right lobe with 3 nodules largest being 1.9 cm, 4.2 cm left lobe with 3 nodules largest being 2.4 cm.   Subsequent thyroid uptake and scan reviewed absence of hyperthyroidism nor any cold nodules.  Her thyroid function tests in May were within normal limits.  She denies dysphagia, shortness of breath, voice change.  Her thyroid function tests on the same day of Sep 15, 2018 was within normal limits.  She denies heat intolerance, palpitations, nor tremors.   Review of Systems Limited as above.    Objective:    There were no vitals taken for  this visit.  Wt Readings from Last 3 Encounters:  10/31/18 127 lb (57.6 kg)  09/27/18 126 lb (57.2 kg)  09/15/18 125 lb (56.7 kg)      CMP     Component Value Date/Time   NA 142 06/08/2016 1615   K 4.2 06/08/2016 1615   CL 106 06/08/2016 1615   CO2 20 06/08/2016 1615   GLUCOSE 95 06/08/2016 1615   GLUCOSE 83 10/01/2015 1244   BUN 8 06/08/2016 1615   CREATININE 0.93 06/08/2016 1615   CALCIUM 8.9 06/08/2016 1615   PROT 7.3 06/08/2016 1615   ALBUMIN 4.4 06/08/2016 1615   AST 16 06/08/2016 1615   ALT 15 06/08/2016 1615   ALKPHOS 61 06/08/2016 1615   BILITOT 0.8 06/08/2016 1615   GFRNONAA 84 06/08/2016 1615   GFRAA 97 06/08/2016 1615       Lab Results  Component Value Date   TSH 0.595 09/15/2018   TSH 0.475 06/08/2016    Sep 15, 2018 thyroid ultrasound: Right lobe 4.6 cm with 3 nodules 1.9 cm, 1.2 cm, and 0.8 cm.  Left lobe measuring 4.2 cm with 3 nodules measuring 2.4 cm, 1.5 cm and 1.6 cm.  These nodules were described as smooth bordered, cystic to solid in consistency.   Thyroid uptake and scan on October 18, 2018  FINDINGS: Uniform uptake within thyroid gland.  No nodularity.  Normal volume.  IMPRESSION: Normal thyroid gland imaging  Assessment & Plan:   1. Multinodular goiter   - I have reviewed her available thyroid records and clinically evaluated the patient. - Based on these reviews, she has euthyroid multinodular goiter. Her thyroid uptake and scan did not confirm cold nodule nor elevated uptake.  She would not need antithyroid intervention at this time.  She will return in 1 year with repeat thyroid function test and thyroid ultrasound.  - I advised her  to maintain close follow up with Terald Sleeper, PA-C for primary care needs.   Time for this visit: 15 minutes. Sunnie Nielsen  participated in the discussions, expressed understanding, and voiced agreement with the above plans.  All questions were answered to her satisfaction. she is encouraged  to contact clinic should she have any questions or concerns prior to her return visit.   Follow up plan: Return in about 1 year (around 11/16/2019) for Follow up with Pre-visit Labs, Thyroid / Neck Ultrasound.   Glade Lloyd, MD [place]  [facility] [address] [place], [place] [address] Phone: [phone]  Fax: [phone]     11/16/2018, 4:30 PM  This note was partially dictated with voice recognition software. Similar sounding words can be transcribed inadequately or may not  be corrected upon review.

## 2018-12-07 ENCOUNTER — Ambulatory Visit (INDEPENDENT_AMBULATORY_CARE_PROVIDER_SITE_OTHER): Payer: Managed Care, Other (non HMO) | Admitting: Physician Assistant

## 2018-12-07 ENCOUNTER — Ambulatory Visit: Payer: Managed Care, Other (non HMO) | Admitting: Physician Assistant

## 2018-12-07 DIAGNOSIS — M25552 Pain in left hip: Secondary | ICD-10-CM

## 2018-12-07 MED ORDER — CYCLOBENZAPRINE HCL 10 MG PO TABS: 10.0000 mg | ORAL_TABLET | Freq: Three times a day (TID) | ORAL | 0 refills | Status: DC | PRN

## 2018-12-07 MED ORDER — DICLOFENAC SODIUM 75 MG PO TBEC: 75.0000 mg | DELAYED_RELEASE_TABLET | Freq: Two times a day (BID) | ORAL | 1 refills | Status: DC

## 2018-12-07 NOTE — Progress Notes (Signed)
Telephone visit  Subjective: CC:hip and shoulder pain PCP: Remus LofflerJones, Shanyah Gattuso S, PA-C ZOX:[id number]:Theresa Watts is a 28 y.o. female calls for telephone consult today. Patient provides verbal consent for consult held via phone.  Patient is identified with 2 separate identifiers.  At this time the entire area is on COVID-19 social distancing and stay home orders are in place.  Patient is of higher risk and therefore we are performing this by a virtual method.  Location of patient: home Location of provider: [facility] Others present for call: no  The patient has been dealing with some ongoing right shoulder pain.  It hurts whenever she moves or lifts.  She does work as a Engineer, civil (consulting)nurse.  Because of her shoulder bothering her she has had to sleep differently and therefore get her left hip flareup.  This is actually the more painful joint.  It hurts whenever she stands, lifts, goes from sitting to standing.  She states that this is starting to bother her with begin at work.  Benefit and an orthopedic referral but then also use diclofenac.  And start Flexeril at bedtime.  She was on meloxicam without any relief.   ROS: Per HPI  Allergies  Allergen Reactions  . Celebrex [Celecoxib] Hives  . Cymbalta [Duloxetine Hcl]     Severe Headache   Past Medical History:  Diagnosis Date  . Medical history non-contributory     Current Outpatient Medications:  .  cyclobenzaprine (FLEXERIL) 10 MG tablet, Take 1 tablet (10 mg total) by mouth 3 (three) times daily as needed for muscle spasms., Disp: 30 tablet, Rfl: 0 .  diclofenac (VOLTAREN) 75 MG EC tablet, Take 1 tablet (75 mg total) by mouth 2 (two) times daily., Disp: 60 tablet, Rfl: 1 .  FLUoxetine (PROZAC) 40 MG capsule, Take 1 capsule (40 mg total) by mouth daily., Disp: 30 capsule, Rfl: 3 .  medroxyPROGESTERone (DEPO-PROVERA) 150 MG/ML injection, Inject 150 mg into the muscle every 3 (three) months., Disp: , Rfl:  .  traZODone (DESYREL) 50 MG tablet, Take 0.5  tablets (25 mg total) by mouth at bedtime as needed for sleep., Disp: 30 tablet, Rfl: 3  Current Facility-Administered Medications:  .  medroxyPROGESTERone (DEPO-PROVERA) injection 150 mg, 150 mg, Intramuscular, Q90 days, Prudy FeelerJones, Rishav Rockefeller S, PA-C, 150 mg at 11/01/18 1129  Assessment/ Plan: 28 y.o. female   1. Pain of left hip joint - cyclobenzaprine (FLEXERIL) 10 MG tablet; Take 1 tablet (10 mg total) by mouth 3 (three) times daily as needed for muscle spasms.  Dispense: 30 tablet; Refill: 0 - diclofenac (VOLTAREN) 75 MG EC tablet; Take 1 tablet (75 mg total) by mouth 2 (two) times daily.  Dispense: 60 tablet; Refill: 1 - Ambulatory referral to Orthopedic Surgery   Return for recheck medications.  Continue all other maintenance medications as listed above.  Start time: 2:23 PM End time: 2:36 PM  Meds ordered this encounter  Medications  . cyclobenzaprine (FLEXERIL) 10 MG tablet    Sig: Take 1 tablet (10 mg total) by mouth 3 (three) times daily as needed for muscle spasms.    Dispense:  30 tablet    Refill:  0    Order Specific Question:   Supervising Provider    Answer:   Raliegh IpGOTTSCHALK, ASHLY M [[id number]]  . diclofenac (VOLTAREN) 75 MG EC tablet    Sig: Take 1 tablet (75 mg total) by mouth 2 (two) times daily.    Dispense:  60 tablet    Refill:  1    Order Specific Question:   Supervising Provider    Answer:   Janora Norlander [[id number]]    Particia Nearing PA-C [facility] [phone]

## 2018-12-07 NOTE — Patient Instructions (Signed)
Piriformis Syndrome Rehab Ask your health care provider which exercises are safe for you. Do exercises exactly as told by your health care provider and adjust them as directed. It is normal to feel mild stretching, pulling, tightness, or discomfort as you do these exercises. Stop right away if you feel sudden pain or your pain gets worse. Do not begin these exercises until told by your health care provider. Stretching and range-of-motion exercises These exercises warm up your muscles and joints and improve the movement and flexibility of your hip and pelvis. The exercises also help to relieve pain, numbness, and tingling. Hip rotation This is an exercise in which you lie on your back and stretch the muscles that rotate your hip (hip rotators) to stretch your buttocks. 1. Lie on your back on a firm surface. 2. Pull your left / right knee toward your same shoulder with your left / right hand until your knee is pointing toward the ceiling. Hold your left / right ankle with your other hand. 3. Keeping your knee steady, gently pull your left / right ankle toward your other shoulder until you feel a stretch in your buttocks. 4. Hold this position for __________ seconds. Repeat __________ times. Complete this exercise __________ times a day. Hip extensor This is an exercise in which you lie on your back and pull your knee to your chest. 1. Lie on your back on a firm surface. Both of your legs should be straight. 2. Pull your left / right knee to your chest. Hold your leg in this position by holding onto the back of your thigh or the front of your knee. 3. Hold this position for __________ seconds. 4. Slowly return to the starting position. Repeat __________ times. Complete this exercise __________ times a day. Strengthening exercises These exercises build strength and endurance in your hip and thigh muscles. Endurance is the ability to use your muscles for a long time, even after they get tired.  Straight leg raises, side-lying This exercise strengthens the muscles that rotate the leg at the hip and move it away from your body (hip abductors). 1. Lie on your side with your left / right leg in the top position. Lie so your head, shoulder, knee, and hip line up. Bend your bottom knee to help you balance. 2. Lift your top leg 4-6 inches (10-15 cm) while keeping your toes pointed straight ahead. 3. Hold this position for __________ seconds. 4. Slowly lower your leg to the starting position. 5. Let your muscles relax completely after each repetition. Repeat __________ times. Complete this exercise __________ times a day. Hip abduction and rotation This is sometimes called quadruped (on hands and knees) exercises. 1. Get on your hands and knees on a firm, lightly padded surface. Your hands should be directly below your shoulders, and your knees should be directly below your hips. 2. Lift your left / right knee out to the side. Keep your knee bent. Do not twist your body. 3. Hold this position for __________ seconds. 4. Slowly lower your leg. Repeat __________ times. Complete this exercise __________ times a day. Straight leg raises, face-down This exercise stretches the muscles that move your hips away from the front of the pelvis (hip extensors). 1. Lie on your abdomen on a bed or a firm surface with a pillow under your hips. 2. Squeeze your buttocks muscles and lift your left / right leg about 4-6 inches (10-15 cm) off the bed. Do not let your back arch. 3. Hold   this position for __________ seconds. 4. Slowly lower your leg to the starting position. 5. Let your muscles relax completely after each repetition. Repeat __________ times. Complete this exercise __________ times a day. This information is not intended to replace advice given to you by your health care provider. Make sure you discuss any questions you have with your health care provider. Document Released: 04/06/2005 Document  Revised: 07/28/2018 Document Reviewed: 01/27/2018 Elsevier Patient Education  2020 [employer]  

## 2018-12-08 ENCOUNTER — Encounter: Payer: Self-pay | Admitting: Physician Assistant

## 2019-01-19 ENCOUNTER — Other Ambulatory Visit: Payer: Self-pay

## 2019-01-19 DIAGNOSIS — Z20822 Contact with and (suspected) exposure to covid-19: Secondary | ICD-10-CM

## 2019-01-20 LAB — NOVEL CORONAVIRUS, NAA: SARS-CoV-2, NAA: NOT DETECTED

## 2019-01-26 ENCOUNTER — Ambulatory Visit (INDEPENDENT_AMBULATORY_CARE_PROVIDER_SITE_OTHER): Payer: Managed Care, Other (non HMO) | Admitting: Family Medicine

## 2019-01-26 ENCOUNTER — Other Ambulatory Visit: Payer: Self-pay

## 2019-01-26 ENCOUNTER — Encounter: Payer: Self-pay | Admitting: Family Medicine

## 2019-01-26 DIAGNOSIS — F411 Generalized anxiety disorder: Secondary | ICD-10-CM | POA: Diagnosis not present

## 2019-01-26 DIAGNOSIS — G4701 Insomnia due to medical condition: Secondary | ICD-10-CM

## 2019-01-26 MED ORDER — TRAZODONE HCL 50 MG PO TABS: 25.0000 mg | ORAL_TABLET | Freq: Every evening | ORAL | 3 refills | Status: DC | PRN

## 2019-01-26 MED ORDER — HYDROXYZINE PAMOATE 25 MG PO CAPS: 25.0000 mg | ORAL_CAPSULE | Freq: Three times a day (TID) | ORAL | 3 refills | Status: DC | PRN

## 2019-01-26 NOTE — Progress Notes (Signed)
Virtual Visit via telephone Note Due to COVID-19 pandemic this visit was conducted virtually. This visit type was conducted due to national recommendations for restrictions regarding the COVID-19 Pandemic (e.g. social distancing, sheltering in place) in an effort to limit this patient's exposure and mitigate transmission in our community. All issues noted in this document were discussed and addressed.  A physical exam was not performed with this format.   I connected with Theresa Watts on 01/26/19 at 1155 by telephone and verified that I am speaking with the correct person using two identifiers. Theresa Watts is currently located at home and family is currently with them during visit. The provider, Kari Baars, FNP is located in their office at time of visit.  I discussed the limitations, risks, security and privacy concerns of performing an evaluation and management service by telephone and the availability of in person appointments. I also discussed with the patient that there may be a patient responsible charge related to this service. The patient expressed understanding and agreed to proceed.  Subjective:  Patient ID: Theresa Watts, female    DOB: 01/15/1991, 28 y.o.   MRN: [id number]  Chief Complaint:  Insomnia and Anxiety   HPI: AADVIKA KONEN is a 28 y.o. female presenting on 01/26/2019 for Insomnia and Anxiety   Pt reports a recent death in the family that has triggered increased anxiety and insomnia. Pt states she is taking her medications as prescribed along with 10 mg of melatonin and is still unable to sleep due to her anxiety. States she has bouts during the day when she can not control her worry and anxiety and feels she may have a panic attacks. She denies SI or HI.   Insomnia Primary symptoms: fragmented sleep, sleep disturbance, difficulty falling asleep, frequent awakening, malaise/fatigue.  The current episode started more than one month. The  problem occurs nightly. The problem has been gradually worsening since onset. The symptoms are aggravated by anxiety, family issues and emotional upset. Past treatments include medication. The treatment provided mild relief. PMH includes: depression, family stress or anxiety. Prior diagnostic workup includes:  Blood work.  Anxiety Presents for follow-up visit. Symptoms include compulsions, decreased concentration, depressed mood, excessive worry, hyperventilation, insomnia, irritability, malaise, nervous/anxious behavior, obsessions and restlessness. Patient reports no chest pain, confusion, dizziness, dry mouth, feeling of choking, impotence, muscle tension, nausea, palpitations, panic, shortness of breath or suicidal ideas. Symptoms occur constantly. The severity of symptoms is moderate. The quality of sleep is poor. Nighttime awakenings: several.   Compliance with medications is 76-100%.     Relevant past medical, surgical, family, and social history reviewed and updated as indicated.  Allergies and medications reviewed and updated.   Past Medical History:  Diagnosis Date  . Medical history non-contributory     Past Surgical History:  Procedure Laterality Date  . NO PAST SURGERIES      Social History   Socioeconomic History  . Marital status: Single    Spouse name: Not on file  . Number of children: Not on file  . Years of education: Not on file  . Highest education level: Not on file  Occupational History  . Not on file  Social Needs  . Financial resource strain: Not on file  . Food insecurity    Worry: Not on file    Inability: Not on file  . Transportation needs    Medical: Not on file    Non-medical: Not on file  Tobacco Use  .  Smoking status: Never Smoker  . Smokeless tobacco: Never Used  Substance and Sexual Activity  . Alcohol use: No  . Drug use: No  . Sexual activity: Yes  Lifestyle  . Physical activity    Days per week: Not on file    Minutes per  session: Not on file  . Stress: Not on file  Relationships  . Social Herbalist on phone: Not on file    Gets together: Not on file    Attends religious service: Not on file    Active member of club or organization: Not on file    Attends meetings of clubs or organizations: Not on file    Relationship status: Not on file  . Intimate partner violence    Fear of current or ex partner: Not on file    Emotionally abused: Not on file    Physically abused: Not on file    Forced sexual activity: Not on file  Other Topics Concern  . Not on file  Social History Narrative  . Not on file    Outpatient Encounter Medications as of 01/26/2019  Medication Sig  . cyclobenzaprine (FLEXERIL) 10 MG tablet Take 1 tablet (10 mg total) by mouth 3 (three) times daily as needed for muscle spasms.  . diclofenac (VOLTAREN) 75 MG EC tablet Take 1 tablet (75 mg total) by mouth 2 (two) times daily.  Marland Kitchen FLUoxetine (PROZAC) 40 MG capsule Take 1 capsule (40 mg total) by mouth daily.  . hydrOXYzine (VISTARIL) 25 MG capsule Take 1 capsule (25 mg total) by mouth 3 (three) times daily as needed for anxiety.  . medroxyPROGESTERone (DEPO-PROVERA) 150 MG/ML injection Inject 150 mg into the muscle every 3 (three) months.  . traZODone (DESYREL) 50 MG tablet Take 0.5 tablets (25 mg total) by mouth at bedtime as needed for sleep.  . [DISCONTINUED] traZODone (DESYREL) 50 MG tablet Take 0.5 tablets (25 mg total) by mouth at bedtime as needed for sleep.   Facility-Administered Encounter Medications as of 01/26/2019  Medication  . medroxyPROGESTERone (DEPO-PROVERA) injection 150 mg    Allergies  Allergen Reactions  . Celebrex [Celecoxib] Hives  . Cymbalta [Duloxetine Hcl]     Severe Headache    Review of Systems  Constitutional: Positive for fever, irritability and malaise/fatigue. Negative for activity change, appetite change, chills, diaphoresis, fatigue and unexpected weight change.  HENT: Negative.   Eyes:  Negative.  Negative for photophobia and visual disturbance.  Respiratory: Negative for cough, chest tightness and shortness of breath.   Cardiovascular: Negative for chest pain, palpitations and leg swelling.  Gastrointestinal: Negative for abdominal pain, blood in stool, constipation, diarrhea, nausea and vomiting.  Endocrine: Negative.   Genitourinary: Negative for dysuria, frequency, impotence and urgency.  Musculoskeletal: Negative for arthralgias and myalgias.  Skin: Negative.   Allergic/Immunologic: Negative.   Neurological: Negative for dizziness and headaches.  Hematological: Negative.   Psychiatric/Behavioral: Positive for agitation, decreased concentration, depression, dysphoric mood and sleep disturbance. Negative for behavioral problems, confusion, hallucinations, self-injury and suicidal ideas. The patient is nervous/anxious and has insomnia. The patient is not hyperactive.   All other systems reviewed and are negative.        Observations/Objective: No vital signs or physical exam, this was a telephone or virtual health encounter.  Pt alert and oriented, answers all questions appropriately, and able to speak in full sentences.    Assessment and Plan: [place] was seen today for insomnia and anxiety.  Diagnoses and all orders for this  visit:  Insomnia due to medical condition GAD (generalized anxiety disorder) Increasing anxiety and insomnia due to death in the family. Will refill trazodone and initiate Vistaril three times daily as needed for severe anxiety or panic. Report any new, worsening, or persistent symptoms. Stress management discussed. Follow up with PCP as needed.  -     hydrOXYzine (VISTARIL) 25 MG capsule; Take 1 capsule (25 mg total) by mouth 3 (three) times daily as needed for anxiety. -     traZODone (DESYREL) 50 MG tablet; Take 0.5 tablets (25 mg total) by mouth at bedtime as needed for sleep.     Follow Up Instructions: Return in about 6 weeks  (around 03/09/2019), or if symptoms worsen or fail to improve, for PCP for anxiety.    I discussed the assessment and treatment plan with the patient. The patient was provided an opportunity to ask questions and all were answered. The patient agreed with the plan and demonstrated an understanding of the instructions.   The patient was advised to call back or seek an in-person evaluation if the symptoms worsen or if the condition fails to improve as anticipated.  The above assessment and management plan was discussed with the patient. The patient verbalized understanding of and has agreed to the management plan. Patient is aware to call the clinic if they develop any new symptoms or if symptoms persist or worsen. Patient is aware when to return to the clinic for a follow-up visit. Patient educated on when it is appropriate to go to the emergency department.    I provided 15 minutes of non-face-to-face time during this encounter. The call started at 1155. The call ended at 1210. The other time was used for coordination of care.    Kari BaarsMichelle Garielle Mroz, FNP-C [facility] [address] [place], [place] [address] [phone] 01/26/19

## 2019-01-30 ENCOUNTER — Other Ambulatory Visit: Payer: Self-pay

## 2019-01-30 ENCOUNTER — Ambulatory Visit (INDEPENDENT_AMBULATORY_CARE_PROVIDER_SITE_OTHER): Payer: Managed Care, Other (non HMO) | Admitting: *Deleted

## 2019-01-30 DIAGNOSIS — Z3042 Encounter for surveillance of injectable contraceptive: Secondary | ICD-10-CM

## 2019-02-08 ENCOUNTER — Ambulatory Visit (INDEPENDENT_AMBULATORY_CARE_PROVIDER_SITE_OTHER): Payer: Managed Care, Other (non HMO) | Admitting: Physician Assistant

## 2019-02-08 ENCOUNTER — Encounter: Payer: Self-pay | Admitting: Physician Assistant

## 2019-02-08 DIAGNOSIS — F411 Generalized anxiety disorder: Secondary | ICD-10-CM | POA: Diagnosis not present

## 2019-02-08 DIAGNOSIS — G4701 Insomnia due to medical condition: Secondary | ICD-10-CM | POA: Diagnosis not present

## 2019-02-08 DIAGNOSIS — F321 Major depressive disorder, single episode, moderate: Secondary | ICD-10-CM

## 2019-02-08 MED ORDER — TRAZODONE HCL 100 MG PO TABS: 100.0000 mg | ORAL_TABLET | Freq: Every evening | ORAL | 2 refills | Status: DC | PRN

## 2019-02-08 MED ORDER — ALPRAZOLAM 0.25 MG PO TABS: 0.2500 mg | ORAL_TABLET | Freq: Two times a day (BID) | ORAL | 0 refills | Status: DC | PRN

## 2019-02-08 MED ORDER — DULOXETINE HCL 60 MG PO CPEP: 60.0000 mg | ORAL_CAPSULE | Freq: Every day | ORAL | 0 refills | Status: DC

## 2019-02-08 NOTE — Progress Notes (Signed)
Telephone visit  Subjective: CC: Depression and anxiety, grief PCP: Remus Loffler, PA-C Theresa Watts is a 28 y.o. female calls for telephone consult today. Patient provides verbal consent for consult held via phone.  Patient is identified with 2 separate identifiers.  At this time the entire area is on COVID-19 social distancing and stay home orders are in place.  Patient is of higher risk and therefore we are performing this by a virtual method.  Location of patient: Home Location of provider: HOME Others present for call: No   This patient has a significant amount of stress and anxiety going on in her life over the last few months.  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  She lost her father just a few weeks ago.  She has been extremely upset and depressed and having very bad dreams ever since then.  She states that they did have a very good relationship and she is just overwhelmed with grief.  She does have small children at home that she is trying to help with distance learning.  She does states that she is just quite overwhelmed.  Her trazodone at 25 mg is not helping her sleep.  She tried the hydroxyzine for anxiety and it did not help.    I have also discussed with the patient that if her employer has an employee assistance counseling program she should take part in it.  However if they do not have it to please give [place] a call and we will set up a virtual behavioral health counseling session for her.   ROS: Per HPI  Allergies  Allergen Reactions  . Celebrex [Celecoxib] Hives   Past Medical History:  Diagnosis Date  . Medical history non-contributory     Current Outpatient Medications:  .  ALPRAZolam (XANAX) 0.25 MG tablet, Take 1 tablet (0.25 mg total) by mouth 2 (two) times daily as needed for anxiety., Disp: 30 tablet, Rfl: 0 .  cyclobenzaprine (FLEXERIL) 10 MG tablet, Take 1 tablet (10 mg  total) by mouth 3 (three) times daily as needed for muscle spasms., Disp: 30 tablet, Rfl: 0 .  diclofenac (VOLTAREN) 75 MG EC tablet, Take 1 tablet (75 mg total) by mouth 2 (two) times daily., Disp: 60 tablet, Rfl: 1 .  DULoxetine (CYMBALTA) 60 MG capsule, Take 1 capsule (60 mg total) by mouth daily., Disp: 90 capsule, Rfl: 0 .  hydrOXYzine (VISTARIL) 25 MG capsule, Take 1 capsule (25 mg total) by mouth 3 (three) times daily as needed for anxiety., Disp: 60 capsule, Rfl: 3 .  medroxyPROGESTERone (DEPO-PROVERA) 150 MG/ML injection, Inject 150 mg into the muscle every 3 (three) months., Disp: , Rfl:  .  traZODone (DESYREL) 100 MG tablet, Take 1 tablet (100 mg total) by mouth at bedtime as needed for sleep., Disp: 90 tablet, Rfl: 2  Current Facility-Administered Medications:  .  medroxyPROGESTERone (DEPO-PROVERA) injection 150 mg, 150 mg, Intramuscular, Q90 days, Prudy Feeler S, PA-C, 150 mg at 01/30/19 1006  Assessment/ Plan: 28 y.o. female   1. Insomnia due to medical condition - traZODone (DESYREL) 100 MG tablet; Take 1 tablet (100 mg total) by mouth at bedtime as needed for sleep.  Dispense: 90 tablet; Refill: 2  2. GAD (generalized anxiety disorder) - DULoxetine (CYMBALTA) 60 MG capsule; Take 1 capsule (60 mg total) by mouth daily.  Dispense: 90 capsule; Refill: 0 - traZODone (DESYREL) 100 MG  tablet; Take 1 tablet (100 mg total) by mouth at bedtime as needed for sleep.  Dispense: 90 tablet; Refill: 2 - ALPRAZolam (XANAX) 0.25 MG tablet; Take 1 tablet (0.25 mg total) by mouth 2 (two) times daily as needed for anxiety.  Dispense: 30 tablet; Refill: 0  3. Depression, major, single episode, moderate (HCC) - DULoxetine (CYMBALTA) 60 MG capsule; Take 1 capsule (60 mg total) by mouth daily.  Dispense: 90 capsule; Refill: 0   No follow-ups on file.  Continue all other maintenance medications as listed above.  Start time: 11:10 AM End time: 11:29 AM  Meds ordered this encounter  Medications   . DULoxetine (CYMBALTA) 60 MG capsule    Sig: Take 1 capsule (60 mg total) by mouth daily.    Dispense:  90 capsule    Refill:  0    Order Specific Question:   Supervising Provider    Answer:   Janora Norlander [[id number]]  . traZODone (DESYREL) 100 MG tablet    Sig: Take 1 tablet (100 mg total) by mouth at bedtime as needed for sleep.    Dispense:  90 tablet    Refill:  2    Order Specific Question:   Supervising Provider    Answer:   Janora Norlander [[id number]]  . ALPRAZolam (XANAX) 0.25 MG tablet    Sig: Take 1 tablet (0.25 mg total) by mouth 2 (two) times daily as needed for anxiety.    Dispense:  30 tablet    Refill:  0    Order Specific Question:   Supervising Provider    Answer:   Janora Norlander [[id number]]    Particia Nearing PA-C [place] [phone]

## 2019-02-10 ENCOUNTER — Ambulatory Visit (INDEPENDENT_AMBULATORY_CARE_PROVIDER_SITE_OTHER): Payer: Managed Care, Other (non HMO) | Admitting: Physician Assistant

## 2019-02-10 ENCOUNTER — Encounter: Payer: Self-pay | Admitting: Physician Assistant

## 2019-02-10 DIAGNOSIS — J020 Streptococcal pharyngitis: Secondary | ICD-10-CM

## 2019-02-10 DIAGNOSIS — R509 Fever, unspecified: Secondary | ICD-10-CM

## 2019-02-10 DIAGNOSIS — J029 Acute pharyngitis, unspecified: Secondary | ICD-10-CM

## 2019-02-10 MED ORDER — AMOXICILLIN 500 MG PO CAPS: 500.0000 mg | ORAL_CAPSULE | Freq: Three times a day (TID) | ORAL | 0 refills | Status: DC

## 2019-02-10 NOTE — Progress Notes (Signed)
       Telephone visit  Subjective: Theresa throat PCP: Terald Sleeper, PA-C NWG:[id number] [name] is a 28 y.o. female calls for telephone consult today. Patient provides verbal consent for consult held via phone.  Patient is identified with 2 separate identifiers.  At this time the entire area is on COVID-19 social distancing and stay home orders are in place.  Patient is of higher risk and therefore we are performing this by a virtual method.  Location of patient: home Location of provider: [facility] Others present for call: no  This patient is having a sore throat over the past 3 days.  It is gotten a lot worse today.  She has white pus areas on her tonsils.  She does not know of any exposure to strep throat.  She does work as a Marine scientist.  She does have small children.  She has had a little bit of elevation in her temperature but no high fever.  No nausea vomiting or diarrhea.   ROS: Per HPI  Allergies  Allergen Reactions  . Celebrex [Celecoxib] Hives   Past Medical History:  Diagnosis Date  . Medical history non-contributory     Current Outpatient Medications:  .  ALPRAZolam (XANAX) 0.25 MG tablet, Take 1 tablet (0.25 mg total) by mouth 2 (two) times daily as needed for anxiety., Disp: 30 tablet, Rfl: 0 .  amoxicillin (AMOXIL) 500 MG capsule, Take 1 capsule (500 mg total) by mouth 3 (three) times daily., Disp: 30 capsule, Rfl: 0 .  cyclobenzaprine (FLEXERIL) 10 MG tablet, Take 1 tablet (10 mg total) by mouth 3 (three) times daily as needed for muscle spasms., Disp: 30 tablet, Rfl: 0 .  diclofenac (VOLTAREN) 75 MG EC tablet, Take 1 tablet (75 mg total) by mouth 2 (two) times daily., Disp: 60 tablet, Rfl: 1 .  DULoxetine (CYMBALTA) 60 MG capsule, Take 1 capsule (60 mg total) by mouth daily., Disp: 90 capsule, Rfl: 0 .  hydrOXYzine (VISTARIL) 25 MG capsule, Take 1 capsule (25 mg total) by mouth 3 (three) times daily as needed for anxiety., Disp: 60 capsule, Rfl: 3 .   medroxyPROGESTERone (DEPO-PROVERA) 150 MG/ML injection, Inject 150 mg into the muscle every 3 (three) months., Disp: , Rfl:  .  traZODone (DESYREL) 100 MG tablet, Take 1 tablet (100 mg total) by mouth at bedtime as needed for sleep., Disp: 90 tablet, Rfl: 2  Current Facility-Administered Medications:  .  medroxyPROGESTERone (DEPO-PROVERA) injection 150 mg, 150 mg, Intramuscular, Q90 days, Particia Nearing S, PA-C, 150 mg at 01/30/19 1006  Assessment/ Plan: 28 y.o. female   1. Sore throat Amoxicillin 500 mg 1 3 times a day  2. Fever, unspecified fever cause Amoxicillin 500 mg 1 tablet 3 times a day  3. Strep pharyngitis Amoxicillin 500 mg 1 tablet 3 times a day   No follow-ups on file.  Continue all other maintenance medications as listed above.  Start time: 10:01 AM End time: 10:09 AM  Meds ordered this encounter  Medications  . amoxicillin (AMOXIL) 500 MG capsule    Sig: Take 1 capsule (500 mg total) by mouth 3 (three) times daily.    Dispense:  30 capsule    Refill:  0    Order Specific Question:   Supervising Provider    Answer:   Janora Norlander [[id number]]    Particia Nearing PA-C [place] [phone]

## 2019-03-15 ENCOUNTER — Encounter: Payer: Self-pay | Admitting: Physician Assistant

## 2019-03-15 ENCOUNTER — Ambulatory Visit (INDEPENDENT_AMBULATORY_CARE_PROVIDER_SITE_OTHER): Payer: Managed Care, Other (non HMO) | Admitting: Physician Assistant

## 2019-03-15 DIAGNOSIS — F411 Generalized anxiety disorder: Secondary | ICD-10-CM | POA: Diagnosis not present

## 2019-03-15 DIAGNOSIS — R4586 Emotional lability: Secondary | ICD-10-CM

## 2019-03-15 DIAGNOSIS — F321 Major depressive disorder, single episode, moderate: Secondary | ICD-10-CM | POA: Diagnosis not present

## 2019-03-15 MED ORDER — RISPERIDONE 0.5 MG PO TABS: 0.5000 mg | ORAL_TABLET | Freq: Every day | ORAL | 5 refills | Status: DC

## 2019-03-15 MED ORDER — DULOXETINE HCL 30 MG PO CPEP: 30.0000 mg | ORAL_CAPSULE | Freq: Every day | ORAL | 3 refills | Status: DC

## 2019-03-15 MED ORDER — ALPRAZOLAM 0.5 MG PO TABS: 0.5000 mg | ORAL_TABLET | Freq: Two times a day (BID) | ORAL | 0 refills | Status: DC | PRN

## 2019-03-15 NOTE — Progress Notes (Signed)
Telephone visit  Subjective: NI:OEVOJJKKXF and anxiety rechck PCP: Terald Sleeper, PA-C GHW:[id number] Theresa Watts is a 28 y.o. female calls for telephone consult today. Patient provides verbal consent for consult held via phone.  Patient is identified with 2 separate identifiers.  At this time the entire area is on COVID-19 social distancing and stay home orders are in place.  Patient is of higher risk and therefore we are performing this by a virtual method.  Location of patient: home Location of provider: HOME Others present for call: no  Patient reports that she cannot tell a huge difference with going up on the Cymbalta to 60 mg.  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  She has 1 son with attention issues and he is not doing well at all.  GAD 7 : Generalized Anxiety Score 03/15/2019 01/26/2019 09/22/2018 08/29/2018  Nervous, Anxious, on Edge 3 3 2 3   Control/stop worrying 3 3 2 3   Worry too much - different things 3 3 2 3   Trouble relaxing 3 2 2 2   Restless 0 0 0 2  Easily annoyed or irritable 3 2 2 3   Afraid - awful might happen 2 1 2 1   Total GAD 7 Score 17 14 12 17   Anxiety Difficulty Very difficult - Somewhat difficult Very difficult    Depression screen [facility] 2/9 03/15/2019 10/31/2018 09/22/2018 08/29/2018 11/11/2016  Decreased Interest 2 0 2 2 0  Down, Depressed, Hopeless 2 0 2 3 0  PHQ - 2 Score 4 0 4 5 0  Altered sleeping 2 0 3 1 -  Tired, decreased energy 2 0 2 3 -  Change in appetite 2 0 0 0 -  Feeling bad or failure about yourself  3 0 1 2 -  Trouble concentrating 2 0 1 2 -  Moving slowly or fidgety/restless 2 0 0 1 -  Suicidal thoughts 0 0 0 0 -  PHQ-9 Score 17 0 11 14 -  Difficult doing work/chores Very difficult - - Very difficult -      ROS: Per HPI  Allergies  Allergen Reactions  . Celebrex [Celecoxib] Hives   Past Medical History:   Diagnosis Date  . Medical history non-contributory     Current Outpatient Medications:  .  ALPRAZolam (XANAX) 0.5 MG tablet, Take 1 tablet (0.5 mg total) by mouth 2 (two) times daily as needed for anxiety., Disp: 30 tablet, Rfl: 0 .  cyclobenzaprine (FLEXERIL) 10 MG tablet, Take 1 tablet (10 mg total) by mouth 3 (three) times daily as needed for muscle spasms., Disp: 30 tablet, Rfl: 0 .  diclofenac (VOLTAREN) 75 MG EC tablet, Take 1 tablet (75 mg total) by mouth 2 (two) times daily., Disp: 60 tablet, Rfl: 1 .  DULoxetine (CYMBALTA) 30 MG capsule, Take 1 capsule (30 mg total) by mouth daily., Disp: 30 capsule, Rfl: 3 .  DULoxetine (CYMBALTA) 60 MG capsule, Take 1 capsule (60 mg total) by mouth daily., Disp: 90 capsule, Rfl: 0 .  hydrOXYzine (VISTARIL) 25 MG capsule, Take 1 capsule (25 mg total) by mouth 3 (three) times daily as needed for anxiety., Disp: 60 capsule, Rfl: 3 .  medroxyPROGESTERone (DEPO-PROVERA) 150 MG/ML injection, Inject 150 mg into the muscle every 3 (three) months., Disp: , Rfl:  .  risperiDONE (RISPERDAL) 0.5 MG tablet, Take 1 tablet (0.5 mg total) by  mouth at bedtime., Disp: 30 tablet, Rfl: 5 .  traZODone (DESYREL) 100 MG tablet, Take 1 tablet (100 mg total) by mouth at bedtime as needed for sleep., Disp: 90 tablet, Rfl: 2  Current Facility-Administered Medications:  .  medroxyPROGESTERone (DEPO-PROVERA) injection 150 mg, 150 mg, Intramuscular, Q90 days, Prudy Feeler S, PA-C, 150 mg at 01/30/19 1006  Assessment/ Plan: 28 y.o. female   1. GAD (generalized anxiety disorder) - DULoxetine (CYMBALTA) 30 MG capsule; Take 1 capsule (30 mg total) by mouth daily.  Dispense: 30 capsule; Refill: 3 - ALPRAZolam (XANAX) 0.5 MG tablet; Take 1 tablet (0.5 mg total) by mouth 2 (two) times daily as needed for anxiety.  Dispense: 30 tablet; Refill: 0  2. Depression, major, single episode, moderate (HCC) - DULoxetine (CYMBALTA) 30 MG capsule; Take 1 capsule (30 mg total) by mouth daily.   Dispense: 30 capsule; Refill: 3 - risperiDONE (RISPERDAL) 0.5 MG tablet; Take 1 tablet (0.5 mg total) by mouth at bedtime.  Dispense: 30 tablet; Refill: 5  3. Mood swing - risperiDONE (RISPERDAL) 0.5 MG tablet; Take 1 tablet (0.5 mg total) by mouth at bedtime.  Dispense: 30 tablet; Refill: 5   Return in about 4 weeks (around 04/12/2019).  Continue all other maintenance medications as listed above.  Start time: 8:09 AM End time: 8:23 AM  Meds ordered this encounter  Medications  . DULoxetine (CYMBALTA) 30 MG capsule    Sig: Take 1 capsule (30 mg total) by mouth daily.    Dispense:  30 capsule    Refill:  3    Order Specific Question:   Supervising Provider    Answer:   Raliegh Ip [[id number]]  . risperiDONE (RISPERDAL) 0.5 MG tablet    Sig: Take 1 tablet (0.5 mg total) by mouth at bedtime.    Dispense:  30 tablet    Refill:  5    Order Specific Question:   Supervising Provider    Answer:   Raliegh Ip [[id number]]  . ALPRAZolam (XANAX) 0.5 MG tablet    Sig: Take 1 tablet (0.5 mg total) by mouth 2 (two) times daily as needed for anxiety.    Dispense:  30 tablet    Refill:  0    Order Specific Question:   Supervising Provider    Answer:   Raliegh Ip [[id number]]    Prudy Feeler PA-C [facility] [phone]

## 2019-03-29 ENCOUNTER — Other Ambulatory Visit: Payer: Self-pay | Admitting: Physician Assistant

## 2019-03-29 DIAGNOSIS — F411 Generalized anxiety disorder: Secondary | ICD-10-CM

## 2019-03-29 DIAGNOSIS — R4586 Emotional lability: Secondary | ICD-10-CM

## 2019-03-29 DIAGNOSIS — F321 Major depressive disorder, single episode, moderate: Secondary | ICD-10-CM

## 2019-04-29 ENCOUNTER — Other Ambulatory Visit: Payer: Self-pay

## 2019-04-29 ENCOUNTER — Ambulatory Visit
Admission: EM | Admit: 2019-04-29 | Discharge: 2019-04-29 | Disposition: A | Discharge status: Home / Self Care | Payer: Managed Care, Other (non HMO) | Attending: Emergency Medicine | Admitting: Emergency Medicine

## 2019-04-29 DIAGNOSIS — Z20822 Contact with and (suspected) exposure to covid-19: Secondary | ICD-10-CM

## 2019-04-29 MED ORDER — FLUTICASONE PROPIONATE 50 MCG/ACT NA SUSP: 2.0000 | Freq: Every day | NASAL | 0 refills | Status: DC

## 2019-04-29 MED ORDER — CETIRIZINE HCL 10 MG PO TABS: 10.0000 mg | ORAL_TABLET | Freq: Every day | ORAL | 0 refills | Status: DC

## 2019-04-29 NOTE — Discharge Instructions (Signed)

## 2019-04-29 NOTE — ED Provider Notes (Signed)
[facility]   [id number] 04/29/19 Arrival Time: 1105   CC: COVID symptoms  SUBJECTIVE: History from: patient.  Theresa Watts is a 29 y.o. female who presents with headache, congestion, runny nose, and fatigue x 2-3 days.  Husband tested positive for COVID.  Denies recent travel.  Denies aggravating or alleviating factors.  Reports previous symptoms in the past with allergies, and weather changes.   Denies fever, chills, sinus pain, sore throat, SOB, wheezing, chest pain, nausea, changes in bowel or bladder habits.    ROS: As per HPI.  All other pertinent ROS negative.     Past Medical History:  Diagnosis Date  . Medical history non-contributory    Past Surgical History:  Procedure Laterality Date  . NO PAST SURGERIES     Allergies  Allergen Reactions  . Celebrex [Celecoxib] Hives   Current Facility-Administered Medications on File Prior to Encounter  Medication Dose Route Frequency Provider Last Rate Last Admin  . medroxyPROGESTERone (DEPO-PROVERA) injection 150 mg  150 mg Intramuscular Q90 days Prudy Feeler S, PA-C   150 mg at 01/30/19 1006   Current Outpatient Medications on File Prior to Encounter  Medication Sig Dispense Refill  . ALPRAZolam (XANAX) 0.5 MG tablet Take 1 tablet (0.5 mg total) by mouth 2 (two) times daily as needed for anxiety. 30 tablet 0  . cyclobenzaprine (FLEXERIL) 10 MG tablet Take 1 tablet (10 mg total) by mouth 3 (three) times daily as needed for muscle spasms. 30 tablet 0  . diclofenac (VOLTAREN) 75 MG EC tablet Take 1 tablet (75 mg total) by mouth 2 (two) times daily. 60 tablet 1  . DULoxetine (CYMBALTA) 30 MG capsule TAKE 1 CAPSULE BY MOUTH EVERY DAY 90 capsule 0  . DULoxetine (CYMBALTA) 60 MG capsule Take 1 capsule (60 mg total) by mouth daily. 90 capsule 0  . hydrOXYzine (VISTARIL) 25 MG capsule Take 1 capsule (25 mg total) by mouth 3 (three) times daily as needed for anxiety. 60 capsule 3  . medroxyPROGESTERone (DEPO-PROVERA) 150  MG/ML injection Inject 150 mg into the muscle every 3 (three) months.    . risperiDONE (RISPERDAL) 0.5 MG tablet TAKE 1 TABLET BY MOUTH AT BEDTIME. 90 tablet 1  . traZODone (DESYREL) 100 MG tablet Take 1 tablet (100 mg total) by mouth at bedtime as needed for sleep. 90 tablet 2   Social History   Socioeconomic History  . Marital status: Single    Spouse name: Not on file  . Number of children: Not on file  . Years of education: Not on file  . Highest education level: Not on file  Occupational History  . Not on file  Tobacco Use  . Smoking status: Never Smoker  . Smokeless tobacco: Never Used  Substance and Sexual Activity  . Alcohol use: No  . Drug use: No  . Sexual activity: Yes  Other Topics Concern  . Not on file  Social History Narrative  . Not on file   Social Determinants of Health   Financial Resource Strain:   . Difficulty of Paying Living Expenses: Not on file  Food Insecurity:   . Worried About Programme researcher, broadcasting/film/video in the Last Year: Not on file  . Ran Out of Food in the Last Year: Not on file  Transportation Needs:   . Lack of Transportation (Medical): Not on file  . Lack of Transportation (Non-Medical): Not on file  Physical Activity:   . Days of Exercise per Week: Not on file  .  Minutes of Exercise per Session: Not on file  Stress:   . Feeling of Stress : Not on file  Social Connections:   . Frequency of Communication with Friends and Family: Not on file  . Frequency of Social Gatherings with Friends and Family: Not on file  . Attends Religious Services: Not on file  . Active Member of Clubs or Organizations: Not on file  . Attends Archivist Meetings: Not on file  . Marital Status: Not on file  Intimate Partner Violence:   . Fear of Current or Ex-Partner: Not on file  . Emotionally Abused: Not on file  . Physically Abused: Not on file  . Sexually Abused: Not on file   Family History  Problem Relation Age of Onset  . Healthy Mother   .  Healthy Father     OBJECTIVE:  Vitals:   04/29/19 1133  BP: 126/85  Pulse: 96  Resp: 16  Temp: 98.2 F (36.8 C)  TempSrc: Oral  SpO2: 99%     General appearance: alert; appears mildly fatigued, but nontoxic; speaking in full sentences and tolerating own secretions HEENT: NCAT; Ears: EACs clear, TMs pearly gray; Eyes: PERRL.  EOM grossly intact. Nose: nares patent without rhinorrhea, Throat: oropharynx clear, tonsils non erythematous or enlarged, uvula midline  Neck: supple without LAD Lungs: unlabored respirations, symmetrical air entry; cough: absent; no respiratory distress; CTAB Heart: regular rate and rhythm.  Skin: warm and dry Psychological: alert and cooperative; normal mood and affect   ASSESSMENT & PLAN:  1. Suspected COVID-19 virus infection     Meds ordered this encounter  Medications  . cetirizine (ZYRTEC) 10 MG tablet    Sig: Take 1 tablet (10 mg total) by mouth daily.    Dispense:  30 tablet    Refill:  0    Order Specific Question:   Supervising Provider    Answer:   Raylene Everts [[id number]]  . fluticasone (FLONASE) 50 MCG/ACT nasal spray    Sig: Place 2 sprays into both nostrils daily.    Dispense:  16 g    Refill:  0    Order Specific Question:   Supervising Provider    Answer:   Raylene Everts [[id number]]   COVID testing ordered.  It will take between 5-7 days for test results.  Someone will contact you regarding abnormal results.    In the meantime: You should remain isolated in your home for 10 days from symptom onset AND greater than 72 hours after symptoms resolution (absence of fever without the use of fever-reducing medication and improvement in respiratory symptoms), whichever is longer Get plenty of rest and push fluids Use OTC zyrtec for nasal congestion, runny nose, and/or sore throat Use OTC flonase for nasal congestion and runny nose Use medications daily for symptom relief Use OTC medications like ibuprofen or tylenol as needed  fever or pain Call or go to the ED if you have any new or worsening symptoms such as fever, cough, shortness of breath, chest tightness, chest pain, turning blue, changes in mental status, etc...   Reviewed expectations re: course of current medical issues. Questions answered. Outlined signs and symptoms indicating need for more acute intervention. Patient verbalized understanding. After Visit Summary given.         Suellen, Durocher, PA-C 04/29/19 1217

## 2019-04-29 NOTE — ED Triage Notes (Signed)
Pt presents to UC w/ c/o runny nose, congestion, headache, fatigue x2-3 days. Pt has had positive covid exposure to husband.

## 2019-04-30 LAB — NOVEL CORONAVIRUS, NAA: SARS-CoV-2, NAA: NOT DETECTED

## 2019-05-08 ENCOUNTER — Telehealth: Payer: Self-pay | Admitting: Physician Assistant

## 2019-05-09 ENCOUNTER — Other Ambulatory Visit: Payer: Managed Care, Other (non HMO)

## 2019-05-09 ENCOUNTER — Other Ambulatory Visit: Payer: Self-pay

## 2019-05-09 ENCOUNTER — Ambulatory Visit (INDEPENDENT_AMBULATORY_CARE_PROVIDER_SITE_OTHER): Payer: Managed Care, Other (non HMO) | Admitting: *Deleted

## 2019-05-09 ENCOUNTER — Other Ambulatory Visit: Payer: Self-pay | Admitting: *Deleted

## 2019-05-09 DIAGNOSIS — Z3042 Encounter for surveillance of injectable contraceptive: Secondary | ICD-10-CM

## 2019-05-09 NOTE — Progress Notes (Signed)
Patient had depoprovera injection given in right deltoid.  She did a urine pregnancy ,(negative result).   She came in  seven days later from original injection due date.

## 2019-05-10 ENCOUNTER — Ambulatory Visit: Payer: Managed Care, Other (non HMO) | Attending: Internal Medicine

## 2019-05-10 DIAGNOSIS — Z20822 Contact with and (suspected) exposure to covid-19: Secondary | ICD-10-CM

## 2019-05-11 LAB — NOVEL CORONAVIRUS, NAA: SARS-CoV-2, NAA: NOT DETECTED

## 2019-05-25 ENCOUNTER — Ambulatory Visit: Payer: Managed Care, Other (non HMO) | Attending: Internal Medicine

## 2019-05-25 ENCOUNTER — Other Ambulatory Visit: Payer: Self-pay

## 2019-05-25 DIAGNOSIS — Z20822 Contact with and (suspected) exposure to covid-19: Secondary | ICD-10-CM

## 2019-05-26 ENCOUNTER — Other Ambulatory Visit: Payer: Managed Care, Other (non HMO)

## 2019-05-26 LAB — NOVEL CORONAVIRUS, NAA: SARS-CoV-2, NAA: NOT DETECTED

## 2019-06-02 ENCOUNTER — Other Ambulatory Visit: Payer: Self-pay

## 2019-06-02 ENCOUNTER — Ambulatory Visit: Payer: Managed Care, Other (non HMO) | Attending: Internal Medicine

## 2019-06-02 ENCOUNTER — Other Ambulatory Visit: Payer: Managed Care, Other (non HMO)

## 2019-06-02 DIAGNOSIS — Z20822 Contact with and (suspected) exposure to covid-19: Secondary | ICD-10-CM

## 2019-06-03 LAB — NOVEL CORONAVIRUS, NAA: SARS-CoV-2, NAA: NOT DETECTED

## 2019-06-16 ENCOUNTER — Ambulatory Visit: Payer: Managed Care, Other (non HMO) | Attending: Internal Medicine

## 2019-06-16 ENCOUNTER — Other Ambulatory Visit: Payer: Self-pay

## 2019-06-16 DIAGNOSIS — Z20822 Contact with and (suspected) exposure to covid-19: Secondary | ICD-10-CM

## 2019-06-17 LAB — NOVEL CORONAVIRUS, NAA: SARS-CoV-2, NAA: NOT DETECTED

## 2019-06-28 ENCOUNTER — Ambulatory Visit: Payer: Managed Care, Other (non HMO) | Attending: Internal Medicine

## 2019-06-28 ENCOUNTER — Other Ambulatory Visit: Payer: Self-pay

## 2019-06-28 DIAGNOSIS — Z20822 Contact with and (suspected) exposure to covid-19: Secondary | ICD-10-CM

## 2019-06-29 LAB — NOVEL CORONAVIRUS, NAA: SARS-CoV-2, NAA: NOT DETECTED

## 2019-07-26 ENCOUNTER — Other Ambulatory Visit: Payer: Self-pay | Admitting: *Deleted

## 2019-07-26 DIAGNOSIS — F411 Generalized anxiety disorder: Secondary | ICD-10-CM

## 2019-07-31 ENCOUNTER — Other Ambulatory Visit: Payer: Self-pay | Admitting: *Deleted

## 2019-07-31 MED ORDER — CETIRIZINE HCL 10 MG PO TABS: 10.0000 mg | ORAL_TABLET | Freq: Every day | ORAL | 2 refills | Status: DC

## 2019-07-31 MED ORDER — FLUTICASONE PROPIONATE 50 MCG/ACT NA SUSP: 2.0000 | Freq: Every day | NASAL | 2 refills | Status: DC

## 2019-08-02 ENCOUNTER — Ambulatory Visit (INDEPENDENT_AMBULATORY_CARE_PROVIDER_SITE_OTHER): Payer: Self-pay | Admitting: *Deleted

## 2019-08-02 ENCOUNTER — Other Ambulatory Visit: Payer: Self-pay

## 2019-08-02 DIAGNOSIS — Z3042 Encounter for surveillance of injectable contraceptive: Secondary | ICD-10-CM

## 2019-08-02 NOTE — Progress Notes (Signed)
Pt given Depo Provera 150mg  injection IM left deltoid and tolerated well.

## 2019-08-14 ENCOUNTER — Other Ambulatory Visit: Payer: Self-pay

## 2019-08-14 ENCOUNTER — Ambulatory Visit: Payer: Self-pay | Attending: Internal Medicine

## 2019-08-14 DIAGNOSIS — Z20822 Contact with and (suspected) exposure to covid-19: Secondary | ICD-10-CM | POA: Insufficient documentation

## 2019-08-15 LAB — SARS-COV-2, NAA 2 DAY TAT

## 2019-08-15 LAB — NOVEL CORONAVIRUS, NAA: SARS-CoV-2, NAA: NOT DETECTED

## 2019-08-16 ENCOUNTER — Other Ambulatory Visit: Payer: Self-pay | Admitting: *Deleted

## 2019-08-16 DIAGNOSIS — G4701 Insomnia due to medical condition: Secondary | ICD-10-CM

## 2019-08-16 DIAGNOSIS — F411 Generalized anxiety disorder: Secondary | ICD-10-CM

## 2019-08-16 DIAGNOSIS — F321 Major depressive disorder, single episode, moderate: Secondary | ICD-10-CM

## 2019-08-16 MED ORDER — TRAZODONE HCL 100 MG PO TABS: 100.0000 mg | ORAL_TABLET | Freq: Every evening | ORAL | 0 refills | Status: DC | PRN

## 2019-08-16 MED ORDER — DULOXETINE HCL 60 MG PO CPEP: 60.0000 mg | ORAL_CAPSULE | Freq: Every day | ORAL | 0 refills | Status: DC

## 2019-08-16 MED ORDER — DULOXETINE HCL 30 MG PO CPEP: ORAL_CAPSULE | ORAL | 0 refills | Status: DC

## 2019-08-16 NOTE — Addendum Note (Signed)
Addended by: Julious Payer D on: 08/16/2019 11:07 AM   Modules accepted: Orders

## 2019-08-16 NOTE — Telephone Encounter (Signed)
Encourage patient to schedule appointment to establish care.

## 2019-10-11 ENCOUNTER — Telehealth: Payer: Self-pay | Admitting: Family Medicine

## 2019-10-11 NOTE — Telephone Encounter (Signed)
Pt would like a call to see when she should schedule her next depo appt.

## 2019-10-11 NOTE — Telephone Encounter (Signed)
Pt aware of dates-

## 2019-11-01 ENCOUNTER — Other Ambulatory Visit: Payer: Self-pay

## 2019-11-01 ENCOUNTER — Ambulatory Visit (INDEPENDENT_AMBULATORY_CARE_PROVIDER_SITE_OTHER): Payer: Self-pay | Admitting: *Deleted

## 2019-11-01 DIAGNOSIS — Z3042 Encounter for surveillance of injectable contraceptive: Secondary | ICD-10-CM

## 2019-11-01 NOTE — Progress Notes (Signed)
Patient in today for Depo Provera injection. 150 mg given IM in left deltoid. Patient tolerated well.

## 2019-11-20 ENCOUNTER — Ambulatory Visit: Payer: Managed Care, Other (non HMO) | Admitting: "Endocrinology

## 2020-01-22 ENCOUNTER — Ambulatory Visit: Payer: Self-pay | Admitting: Family Medicine

## 2020-01-24 ENCOUNTER — Other Ambulatory Visit: Payer: Self-pay

## 2020-01-24 ENCOUNTER — Encounter: Payer: Self-pay | Admitting: Family Medicine

## 2020-01-24 ENCOUNTER — Ambulatory Visit (INDEPENDENT_AMBULATORY_CARE_PROVIDER_SITE_OTHER): Payer: 59 | Admitting: Family Medicine

## 2020-01-24 VITALS — BP 122/71 | HR 101 | Temp 99.0°F | Ht 63.0 in | Wt 146.2 lb

## 2020-01-24 DIAGNOSIS — E042 Nontoxic multinodular goiter: Secondary | ICD-10-CM

## 2020-01-24 DIAGNOSIS — Z3042 Encounter for surveillance of injectable contraceptive: Secondary | ICD-10-CM | POA: Diagnosis not present

## 2020-01-24 DIAGNOSIS — F339 Major depressive disorder, recurrent, unspecified: Secondary | ICD-10-CM

## 2020-01-24 DIAGNOSIS — F411 Generalized anxiety disorder: Secondary | ICD-10-CM

## 2020-01-24 MED ORDER — DULOXETINE HCL 30 MG PO CPEP: 30.0000 mg | ORAL_CAPSULE | Freq: Two times a day (BID) | ORAL | 0 refills | Status: DC

## 2020-01-24 MED ORDER — BUSPIRONE HCL 7.5 MG PO TABS: 7.5000 mg | ORAL_TABLET | Freq: Three times a day (TID) | ORAL | 0 refills | Status: DC | PRN

## 2020-01-24 NOTE — Progress Notes (Addendum)
Subjective: CC: anxiety, depression PCP: [name], FNP  [id number] Theresa Watts is a 29 y.o. female presenting to clinic today for:  1. Anxiety [name] reports anxiety that is chronic. She reports social and general anxiety. She reports feeling anxious, excessive worrying, restlessness, irritability, and difficulty relaxing. 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 She denies panic attacks. She has previously tried Xanax with some relief. She has tried vistaril before but she is unable to remember if it helped or not. She has not had medication for 4-5 months.   2. Depression [name] report recurrent depression. She reports little interest in doing things, crying, feeling down, irritability, tired, trouble concentrating, and days of poor appetite as well as days of overeating. She reports difficulty sleeping. She gets maybe 6 hours of sleep a night because she tosses and turns. She also has mood swings and low libido. She was on Risperidone for a short period but can't remember if it helped. She denies a history of bipolar disorder and denies episodes of mania. She has tried melatonin for sleep without much improvement. She has tried trazodone for sleep for it made her too groggy the following day. She has not had medications for 4-5 months.   3. Goiter [name] has a goiter for which she had an ultrasound done in 2018. She saw an endocrinologist for this. He told her that there was no areas of concern and her thyroid levels were normal. . She was supposed to have her yearly follow up in August with endo, but she did not due to lack of insurance and cost.   Relevant past medical, surgical, family, and social history reviewed and updated as indicated.  Allergies and medications reviewed and updated.  Allergies  Allergen Reactions  . Celebrex [Celecoxib] Hives   Past Medical History:  Diagnosis  Date  . Medical history non-contributory     Current Outpatient Medications:  .  medroxyPROGESTERone (DEPO-PROVERA) 150 MG/ML injection, Inject 150 mg into the muscle every 3 (three) months., Disp: , Rfl:  .  risperiDONE (RISPERDAL) 0.5 MG tablet, TAKE 1 TABLET BY MOUTH AT BEDTIME., Disp: 90 tablet, Rfl: 1 .  busPIRone (BUSPAR) 7.5 MG tablet, Take 1 tablet (7.5 mg total) by mouth 3 (three) times daily as needed (for anxiety)., Disp: 90 tablet, Rfl: 0 .  DULoxetine (CYMBALTA) 30 MG capsule, Take 1 capsule (30 mg total) by mouth 2 (two) times daily. Take 1 capsule daily x1 week, then take 2 capsules daily., Disp: 60 capsule, Rfl: 0  Current Facility-Administered Medications:  .  medroxyPROGESTERone (DEPO-PROVERA) injection 150 mg, 150 mg, Intramuscular, Q90 days, Jones, Angel S, PA-C, 150 mg at 11/01/19 1511 Social History   Socioeconomic History  . Marital status: Single    Spouse name: Not on file  . Number of children: Not on file  . Years of education: Not on file  . Highest education level: Not on file  Occupational History  . Not on file  Tobacco Use  . Smoking status: Never Smoker  . Smokeless tobacco: Never Used  Vaping Use  . Vaping Use: Never used  Substance and Sexual Activity  . Alcohol use: No  . Drug use: No  . Sexual activity: Yes  Other Topics Concern  . Not on file  Social History Narrative  . Not on file   Social Determinants of Health   Financial Resource Strain:   .  Difficulty of Paying Living Expenses: Not on file  Food Insecurity:   . Worried About Programme researcher, broadcasting/film/video in the Last Year: Not on file  . Ran Out of Food in the Last Year: Not on file  Transportation Needs:   . Lack of Transportation (Medical): Not on file  . Lack of Transportation (Non-Medical): Not on file  Physical Activity:   . Days of Exercise per Week: Not on file  . Minutes of Exercise per Session: Not on file  Stress:   . Feeling of Stress : Not on file  Social Connections:     . Frequency of Communication with Friends and Family: Not on file  . Frequency of Social Gatherings with Friends and Family: Not on file  . Attends Religious Services: Not on file  . Active Member of Clubs or Organizations: Not on file  . Attends Banker Meetings: Not on file  . Marital Status: Not on file  Intimate Partner Violence:   . Fear of Current or Ex-Partner: Not on file  . Emotionally Abused: Not on file  . Physically Abused: Not on file  . Sexually Abused: Not on file   Family History  Problem Relation Age of Onset  . Healthy Mother   . Healthy Father     Review of Systems  HENT: Negative for trouble swallowing and voice change.   Respiratory: Negative for shortness of breath.   Cardiovascular: Negative for chest pain, palpitations and leg swelling.  Gastrointestinal: Negative for abdominal pain, constipation, diarrhea, nausea and vomiting.  Endocrine: Negative for cold intolerance, heat intolerance, polydipsia, polyphagia and polyuria.  Psychiatric/Behavioral: Positive for decreased concentration, dysphoric mood and sleep disturbance. Negative for behavioral problems, confusion, hallucinations, self-injury and suicidal ideas. The patient is nervous/anxious. The patient is not hyperactive.      Office Visit from 01/24/2020 in [facility]  PHQ-9 Total Score 19     GAD 7 : Generalized Anxiety Score 01/24/2020 03/15/2019 01/26/2019 09/22/2018  Nervous, Anxious, on Edge 2 3 3 2   Control/stop worrying 2 3 3 2   Worry too much - different things 2 3 3 2   Trouble relaxing 2 3 2 2   Restless 1 0 0 0  Easily annoyed or irritable 3 3 2 2   Afraid - awful might happen 1 2 1 2   Total GAD 7 Score 13 17 14 12   Anxiety Difficulty Somewhat difficult Very difficult - Somewhat difficult   Objective: Office vital signs reviewed. There were no vitals taken for this visit.  Physical Examination:  Physical Exam Vitals and nursing note reviewed.   Constitutional:      General: She is not in acute distress.    Appearance: Normal appearance. She is not ill-appearing, toxic-appearing or diaphoretic.  Cardiovascular:     Rate and Rhythm: Normal rate and regular rhythm.     Heart sounds: Normal heart sounds. No murmur heard.   Pulmonary:     Effort: Pulmonary effort is normal.     Breath sounds: Normal breath sounds.  Musculoskeletal:     Right lower leg: No edema.     Left lower leg: No edema.  Skin:    General: Skin is warm and dry.  Neurological:     General: No focal deficit present.     Mental Status: She is alert and oriented to person, place, and time.  Psychiatric:        Attention and Perception: Attention and perception normal.  Mood and Affect: Affect is tearful.        Speech: Speech normal.        Behavior: Behavior normal. Behavior is cooperative.        Thought Content: Thought content normal.        Judgment: Judgment normal.      Assessment/ Plan: [name] was seen today for anxiety and depression.  Diagnoses and all orders for this visit:  Depression, recurrent (HCC) PHQ9 score is 19 today. No SI or HI. Restart Cymbalta.Therapy or referral to psych declined today. If symptoms are not well controlled may need to increase Cymbalta or add Wellbutrin if low libido continues. Follow up in 1 month to reassess, sooner if new or worsening symptoms.  -     DULoxetine (CYMBALTA) 30 MG capsule; Take 1 capsule (30 mg total) by mouth 2 (two) times daily. Take 1 capsule daily x1 week, then take 2 capsules daily.  GAD (generalized anxiety disorder) GAD7 score was 13 today. Restart Cymbalta. Start Buspar 7.5 mg TID prn, may increase to 15 mg if no improvement in symptoms before next visit. Follow up in 1 months.  -     DULoxetine (CYMBALTA) 30 MG capsule; Take 1 capsule (30 mg total) by mouth 2 (two) times daily. Take 1 capsule daily x1 week, then take 2 capsules daily. -     busPIRone (BUSPAR) 7.5 MG tablet; Take  1 tablet (7.5 mg total) by mouth 3 (three) times daily as needed (for anxiety).  Multinodular goiter Thyroid panel pending. Will address further with CPE in 1 month, sooner if labs are abnormal. -     Thyroid Panel With TSH  Patient brought Depo injection from home, injection given in office today.   The above assessment and management plan was discussed with the patient. The patient verbalized understanding of and has agreed to the management plan. Patient is aware to call the clinic if symptoms persist or worsen. Patient is aware when to return to the clinic for a follow-up visit. Patient educated on when it is appropriate to go to the emergency department.   Harlow Mares, FNP-C [facility] [address] [place], [place] [address] [phone]

## 2020-01-24 NOTE — Patient Instructions (Signed)
Major Depressive Disorder, Adult Major depressive disorder (MDD) is a mental health condition. MDD often makes you feel sad, hopeless, or helpless. MDD can also cause symptoms in your body. MDD can affect your:  Work.  School.  Relationships.  Other normal activities. MDD can range from mild to very bad. It may occur once (single episode MDD). It can also occur many times (recurrent MDD). The main symptoms of MDD often include:  Feeling sad, depressed, or irritable most of the time.  Loss of interest. MDD symptoms also include:  Sleeping too much or too little.  Eating too much or too little.  A change in your weight.  Feeling tired (fatigue) or having low energy.  Feeling worthless.  Feeling guilty.  Trouble making decisions.  Trouble thinking clearly.  Thoughts of suicide or harming others.  Feeling weak.  Feeling agitated.  Keeping yourself from being around other people (isolation). Follow these instructions at home: Activity  Do these things as told by your doctor: ? Go back to your normal activities. ? Exercise regularly. ? Spend time outdoors. Alcohol  Talk with your doctor about how alcohol can affect your antidepressant medicines.  Do not drink alcohol. Or, limit how much alcohol you drink. ? This means no more than 1 drink a day for nonpregnant women and 2 drinks a day for men. One drink equals one of these:  12 oz of beer.  5 oz of wine.  1 oz of hard liquor. General instructions  Take over-the-counter and prescription medicines only as told by your doctor.  Eat a healthy diet.  Get plenty of sleep.  Find activities that you enjoy. Make time to do them.  Think about joining a support group. Your doctor may be able to suggest a group for you.  Keep all follow-up visits as told by your doctor. This is important. Where to find more information:  National Alliance on Mental Illness: ? www.nami.org  [place] National Institute of Mental  Health: ? www.nimh.nih.gov  National Suicide Prevention Lifeline: ? 1-800-273-8255. This is free, 24-hour help. Contact a doctor if:  Your symptoms get worse.  You have new symptoms. Get help right away if:  You self-harm.  You see, hear, taste, smell, or feel things that are not present (hallucinate). If you ever feel like you may hurt yourself or others, or have thoughts about taking your own life, get help right away. You can go to your nearest emergency department or call:  Your local emergency services (911 in the [place]  A suicide crisis helpline, such as the National Suicide Prevention Lifeline: ? 1-800-273-8255. This is open 24 hours a day. This information is not intended to replace advice given to you by your health care provider. Make sure you discuss any questions you have with your health care provider. Document Revised: 03/19/2017 Document Reviewed: 12/22/2015 Elsevier Patient Education  2020 Elsevier Inc.  Generalized Anxiety Disorder, Adult Generalized anxiety disorder (GAD) is a mental health disorder. People with this condition constantly worry about everyday events. Unlike normal anxiety, worry related to GAD is not triggered by a specific event. These worries also do not fade or get better with time. GAD interferes with life functions, including relationships, work, and school. GAD can vary from mild to severe. People with severe GAD can have intense waves of anxiety with physical symptoms (panic attacks). What are the causes? The exact cause of GAD is not known. What increases the risk? This condition is more likely to develop in:    Women.  People who have a family history of anxiety disorders.  People who are very shy.  People who experience very stressful life events, such as the death of a loved one.  People who have a very stressful family environment. What are the signs or symptoms? People with GAD often worry excessively about many things in their  lives, such as their health and family. They may also be overly concerned about:  Doing well at work.  Being on time.  Natural disasters.  Friendships. Physical symptoms of GAD include:  Fatigue.  Muscle tension or having muscle twitches.  Trembling or feeling shaky.  Being easily startled.  Feeling like your heart is pounding or racing.  Feeling out of breath or like you cannot take a deep breath.  Having trouble falling asleep or staying asleep.  Sweating.  Nausea, diarrhea, or irritable bowel syndrome (IBS).  Headaches.  Trouble concentrating or remembering facts.  Restlessness.  Irritability. How is this diagnosed? Your health care provider can diagnose GAD based on your symptoms and medical history. You will also have a physical exam. The health care provider will ask specific questions about your symptoms, including how severe they are, when they started, and if they come and go. Your health care provider may ask you about your use of alcohol or drugs, including prescription medicines. Your health care provider may refer you to a mental health specialist for further evaluation. Your health care provider will do a thorough examination and may perform additional tests to rule out other possible causes of your symptoms. To be diagnosed with GAD, a person must have anxiety that:  Is out of his or her control.  Affects several different aspects of his or her life, such as work and relationships.  Causes distress that makes him or her unable to take part in normal activities.  Includes at least three physical symptoms of GAD, such as restlessness, fatigue, trouble concentrating, irritability, muscle tension, or sleep problems. Before your health care provider can confirm a diagnosis of GAD, these symptoms must be present more days than they are not, and they must last for six months or longer. How is this treated? The following therapies are usually used to treat  GAD:  Medicine. Antidepressant medicine is usually prescribed for long-term daily control. Antianxiety medicines may be added in severe cases, especially when panic attacks occur.  Talk therapy (psychotherapy). Certain types of talk therapy can be helpful in treating GAD by providing support, education, and guidance. Options include: ? Cognitive behavioral therapy (CBT). People learn coping skills and techniques to ease their anxiety. They learn to identify unrealistic or negative thoughts and behaviors and to replace them with positive ones. ? Acceptance and commitment therapy (ACT). This treatment teaches people how to be mindful as a way to cope with unwanted thoughts and feelings. ? Biofeedback. This process trains you to manage your body's response (physiological response) through breathing techniques and relaxation methods. You will work with a therapist while machines are used to monitor your physical symptoms.  Stress management techniques. These include yoga, meditation, and exercise. A mental health specialist can help determine which treatment is best for you. Some people see improvement with one type of therapy. However, other people require a combination of therapies. Follow these instructions at home:  Take over-the-counter and prescription medicines only as told by your health care provider.  Try to maintain a normal routine.  Try to anticipate stressful situations and allow extra time to manage them.    Practice any stress management or self-calming techniques as taught by your health care provider.  Do not punish yourself for setbacks or for not making progress.  Try to recognize your accomplishments, even if they are small.  Keep all follow-up visits as told by your health care provider. This is important. Contact a health care provider if:  Your symptoms do not get better.  Your symptoms get worse.  You have signs of depression, such as: ? A persistently sad, cranky,  or irritable mood. ? Loss of enjoyment in activities that used to bring you joy. ? Change in weight or eating. ? Changes in sleeping habits. ? Avoiding friends or family members. ? Loss of energy for normal tasks. ? Feelings of guilt or worthlessness. Get help right away if:  You have serious thoughts about hurting yourself or others. If you ever feel like you may hurt yourself or others, or have thoughts about taking your own life, get help right away. You can go to your nearest emergency department or call:  Your local emergency services (911 in the [place]  A suicide crisis helpline, such as the National Suicide Prevention Lifeline at 1-800-273-8255. This is open 24 hours a day. Summary  Generalized anxiety disorder (GAD) is a mental health disorder that involves worry that is not triggered by a specific event.  People with GAD often worry excessively about many things in their lives, such as their health and family.  GAD may cause physical symptoms such as restlessness, trouble concentrating, sleep problems, frequent sweating, nausea, diarrhea, headaches, and trembling or muscle twitching.  A mental health specialist can help determine which treatment is best for you. Some people see improvement with one type of therapy. However, other people require a combination of therapies. This information is not intended to replace advice given to you by your health care provider. Make sure you discuss any questions you have with your health care provider. Document Revised: 03/19/2017 Document Reviewed: 02/25/2016 Elsevier Patient Education  2020 Elsevier Inc.  

## 2020-01-25 LAB — THYROID PANEL WITH TSH
Free Thyroxine Index: 1.6 (ref 1.2–4.9)
T3 Uptake Ratio: 31 % (ref 24–39)
T4, Total: 5.3 ug/dL (ref 4.5–12.0)
TSH: 0.373 u[IU]/mL — ABNORMAL LOW (ref 0.450–4.500)

## 2020-01-29 ENCOUNTER — Telehealth: Payer: Self-pay

## 2020-01-29 ENCOUNTER — Other Ambulatory Visit: Payer: Self-pay | Admitting: Family Medicine

## 2020-01-29 DIAGNOSIS — R110 Nausea: Secondary | ICD-10-CM

## 2020-01-29 MED ORDER — ONDANSETRON 4 MG PO TBDP: 4.0000 mg | ORAL_TABLET | Freq: Three times a day (TID) | ORAL | 0 refills | Status: DC | PRN

## 2020-01-29 NOTE — Telephone Encounter (Signed)
Aware and verbalizes understanding.  

## 2020-01-29 NOTE — Telephone Encounter (Signed)
Sent in zofran. Nausea should start to improve, but if it doesn't we may need to change medications or try a lower dosage.

## 2020-01-30 ENCOUNTER — Encounter: Payer: Self-pay | Admitting: "Endocrinology

## 2020-01-30 ENCOUNTER — Ambulatory Visit (INDEPENDENT_AMBULATORY_CARE_PROVIDER_SITE_OTHER): Payer: 59 | Admitting: "Endocrinology

## 2020-01-30 ENCOUNTER — Other Ambulatory Visit: Payer: Self-pay

## 2020-01-30 VITALS — BP 116/78 | HR 92 | Ht 63.0 in | Wt 143.8 lb

## 2020-01-30 DIAGNOSIS — E042 Nontoxic multinodular goiter: Secondary | ICD-10-CM

## 2020-01-30 NOTE — Progress Notes (Signed)
01/30/2020, 6:14 PM  Endocrinology follow-up note   Subjective:    Patient ID: Theresa Watts, female    DOB: 08-Nov-1990, PCP Theresa Earing, FNP   Past Medical History:  Diagnosis Date  . Medical history non-contributory    Past Surgical History:  Procedure Laterality Date  . NO PAST SURGERIES     Social History   Socioeconomic History  . Marital status: Single    Spouse name: Not on file  . Number of children: Not on file  . Years of education: Not on file  . Highest education level: Not on file  Occupational History  . Not on file  Tobacco Use  . Smoking status: Never Smoker  . Smokeless tobacco: Never Used  Vaping Use  . Vaping Use: Never used  Substance and Sexual Activity  . Alcohol use: No  . Drug use: No  . Sexual activity: Yes  Other Topics Concern  . Not on file  Social History Narrative  . Not on file   Social Determinants of Health   Financial Resource Strain:   . Difficulty of Paying Living Expenses: Not on file  Food Insecurity:   . Worried About Programme researcher, broadcasting/film/video in the Last Year: Not on file  . Ran Out of Food in the Last Year: Not on file  Transportation Needs:   . Lack of Transportation (Medical): Not on file  . Lack of Transportation (Non-Medical): Not on file  Physical Activity:   . Days of Exercise per Week: Not on file  . Minutes of Exercise per Session: Not on file  Stress:   . Feeling of Stress : Not on file  Social Connections:   . Frequency of Communication with Friends and Family: Not on file  . Frequency of Social Gatherings with Friends and Family: Not on file  . Attends Religious Services: Not on file  . Active Member of Clubs or Organizations: Not on file  . Attends Banker Meetings: Not on file  . Marital Status: Not on file   Family History  Problem Relation Age of Onset  . Healthy Mother   . Healthy Father    Outpatient Encounter  Medications as of 01/30/2020  Medication Sig  . busPIRone (BUSPAR) 7.5 MG tablet Take 1 tablet (7.5 mg total) by mouth 3 (three) times daily as needed (for anxiety).  . DULoxetine (CYMBALTA) 30 MG capsule Take 1 capsule (30 mg total) by mouth 2 (two) times daily. Take 1 capsule daily x1 week, then take 2 capsules daily.  . medroxyPROGESTERone (DEPO-PROVERA) 150 MG/ML injection Inject 150 mg into the muscle every 3 (three) months.  . ondansetron (ZOFRAN ODT) 4 MG disintegrating tablet Take 1 tablet (4 mg total) by mouth every 8 (eight) hours as needed for nausea or vomiting.   Facility-Administered Encounter Medications as of 01/30/2020  Medication  . medroxyPROGESTERone (DEPO-PROVERA) injection 150 mg   ALLERGIES: Allergies  Allergen Reactions  . Celebrex [Celecoxib] Hives    VACCINATION STATUS: Immunization History  Administered Date(s) Administered  . Hepatitis B 01/20/2001, 02/24/2001, 07/07/2001    HPI Theresa Watts is 29 y.o. female who presents today with a medical history as above. she is being being seen in  follow-up for multinodular goiter with mild subclinical hyperthyroidism.  She is not on any antithyroid intervention.  She presents with significant weight gain since last visit.     She was found to have clinical goiter at the beginning of May 2020 for which she underwent thyroid ultrasound on Sep 15, 2018 which showed 4.6 cm right lobe with 3 nodules largest being 1.9 cm, 4.2 cm left lobe with 3 nodules largest being 2.4 cm.   Subsequent thyroid uptake and scan reviewed absence of hyperthyroidism nor any cold nodules.    She denies dysphagia, shortness of breath, voice change.  Her thyroid function tests on the same day of Sep 15, 2018 was within normal limits, repeat thyroid function test show slightly suppressed TSH of 0.37, normal T4, free thyroxine index.  She denies heat intolerance, palpitations, nor tremors.   Review of Systems Limited as  above.    Objective:    BP 116/78   Pulse 92   Ht 5\' 3"  (1.6 m)   Wt 143 lb 12.8 oz (65.2 kg)   BMI 25.47 kg/m   Wt Readings from Last 3 Encounters:  01/30/20 143 lb 12.8 oz (65.2 kg)  01/24/20 146 lb 3.2 oz (66.3 kg)  10/31/18 127 lb (57.6 kg)      CMP     Component Value Date/Time   NA 142 06/08/2016 1615   K 4.2 06/08/2016 1615   CL 106 06/08/2016 1615   CO2 20 06/08/2016 1615   GLUCOSE 95 06/08/2016 1615   GLUCOSE 83 10/01/2015 1244   BUN 8 06/08/2016 1615   CREATININE 0.93 06/08/2016 1615   CALCIUM 8.9 06/08/2016 1615   PROT 7.3 06/08/2016 1615   ALBUMIN 4.4 06/08/2016 1615   AST 16 06/08/2016 1615   ALT 15 06/08/2016 1615   ALKPHOS 61 06/08/2016 1615   BILITOT 0.8 06/08/2016 1615   GFRNONAA 84 06/08/2016 1615   GFRAA 97 06/08/2016 1615       Lab Results  Component Value Date   TSH 0.373 (L) 01/24/2020   TSH 0.595 09/15/2018   TSH 0.475 06/08/2016    Sep 15, 2018 thyroid ultrasound: Right lobe 4.6 cm with 3 nodules 1.9 cm, 1.2 cm, and 0.8 cm.  Left lobe measuring 4.2 cm with 3 nodules measuring 2.4 cm, 1.5 cm and 1.6 cm.  These nodules were described as smooth bordered, cystic to solid in consistency.   Thyroid uptake and scan on October 18, 2018  FINDINGS: Uniform uptake within thyroid gland.  No nodularity.  Normal volume.  IMPRESSION: Normal thyroid gland imaging  Assessment & Plan:   1. Multinodular goiter Her previsit thyroid function tests are consistent with mild subclinical hyperthyroidism.  However, in light of her presentation with significant weight gain, she would not be given antithyroid intervention at this time.  She is approached with plan to repeat thyroid function test and office visit in 3 months and she agrees.  Her previous thyroid uptake and scan did not confirm cold nodule nor elevated uptake.   She may need repeat surveillance thyroid ultrasound after her next visit.  - I advised her  to maintain close follow up with  October 20, 2018, FNP for primary care needs.     - Time spent on this patient care encounter:  20 minutes of which 50% was spent in  counseling and the rest reviewing  her current and  previous labs / studies and medications  doses and developing a plan for long term care. Theresa Watts  Cleaver  participated in the discussions, expressed understanding, and voiced agreement with the above plans.  All questions were answered to her satisfaction. she is encouraged to contact clinic should she have any questions or concerns prior to her return visit.    Follow up plan: Return in about 4 months (around 06/01/2020) for F/U with Pre-visit Labs.   Marquis Lunch, MD [facility] [address] [place], [place] [address] Phone: [phone]  Fax: [phone]     01/30/2020, 6:14 PM  This note was partially dictated with voice recognition software. Similar sounding words can be transcribed inadequately or may not  be corrected upon review.

## 2020-02-16 ENCOUNTER — Other Ambulatory Visit: Payer: Self-pay | Admitting: Family Medicine

## 2020-02-16 DIAGNOSIS — F339 Major depressive disorder, recurrent, unspecified: Secondary | ICD-10-CM

## 2020-02-16 DIAGNOSIS — F411 Generalized anxiety disorder: Secondary | ICD-10-CM

## 2020-02-21 ENCOUNTER — Encounter: Payer: 59 | Admitting: Family Medicine

## 2020-03-04 ENCOUNTER — Encounter: Payer: 59 | Admitting: Family Medicine

## 2020-03-21 IMAGING — NM NUCLEAR MEDICINE THYROID SCAN
4 series · 4 of 4 positions shown · non-contrast
Comparison: None

Correlation: Ultrasound thyroid gland 09/15/2018

CLINICAL DATA: Thyroid nodule, multinodular goiter, neck swelling,
some difficulty swallowing

EXAM:
THYROID SCAN
TECHNIQUE: Following the intravenous administration of radiopharmaceutical, pin
hole collimated images were obtained of the thyroid gland. The
patient held the capsule contain the ingested does within her mouth
till dissolved, but spit out part of the capsule and some of the
activity; this precluded calculation of an accurate 24 hour uptake.
Thyroid imaging was performed at 4 hours.
RADIOPHARMACEUTICALS:  300 mCi 1echnetium-99m pertechnetate IV

[Series 1: ant w marker · 1.18mm/px · 1 of 1 slices shown]
[im 1/1]
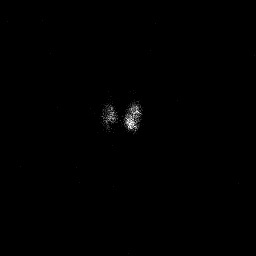

[Series 2: anterior · 1.18mm/px · 1 of 1 slices shown]
[im 1/1]
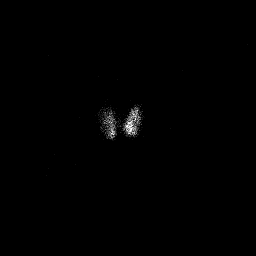

[Series 3: lao · 1.18mm/px · 1 of 1 slices shown]
[im 1/1]
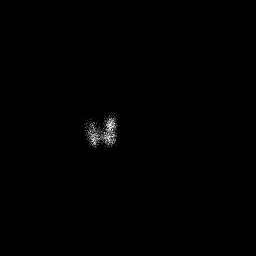

[Series 4: rao · 1.18mm/px · 1 of 1 slices shown]
[im 1/1]
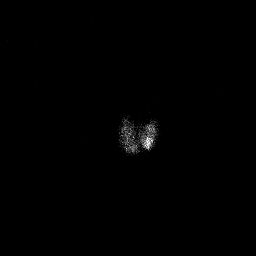

[4 of 4 positions shown; findings below may reference images not displayed]

FINDINGS: Inhomogeneous tracer localization is seen in both thyroid lobes.
Focal areas of increased tracer localization are seen at the upper
and inferior poles of the LEFT lobe. Relatively diminished uptake of
tracer is seen at the upper pole the RIGHT lobe. Findings are
consistent with a multinodular thyroid gland.
IMPRESSION: Multinodular thyroid gland.

Unable to obtain an accurate 24 hour radio iodine uptake since the
patient spit out part of the ingested dose; if radioactive iodine
uptake calculation is yet required, recommend follow-up 24 hour
radioactive iodine uptake exam without imaging.

## 2020-03-22 ENCOUNTER — Ambulatory Visit: Payer: 59 | Admitting: Family Medicine

## 2020-03-28 ENCOUNTER — Other Ambulatory Visit: Payer: Self-pay

## 2020-03-28 ENCOUNTER — Ambulatory Visit
Admission: EM | Admit: 2020-03-28 | Discharge: 2020-03-28 | Disposition: A | Discharge status: Home / Self Care | Payer: 59 | Attending: Emergency Medicine | Admitting: Emergency Medicine

## 2020-03-28 ENCOUNTER — Encounter: Payer: Self-pay | Admitting: Emergency Medicine

## 2020-03-28 DIAGNOSIS — J3489 Other specified disorders of nose and nasal sinuses: Secondary | ICD-10-CM | POA: Diagnosis not present

## 2020-03-28 DIAGNOSIS — Z1152 Encounter for screening for COVID-19: Secondary | ICD-10-CM

## 2020-03-28 DIAGNOSIS — J069 Acute upper respiratory infection, unspecified: Secondary | ICD-10-CM

## 2020-03-28 MED ORDER — FLUTICASONE PROPIONATE 50 MCG/ACT NA SUSP: 1.0000 | Freq: Every day | NASAL | 0 refills | Status: DC

## 2020-03-28 MED ORDER — AZITHROMYCIN 250 MG PO TABS: 250.0000 mg | ORAL_TABLET | Freq: Every day | ORAL | 0 refills | Status: DC

## 2020-03-28 MED ORDER — BENZONATATE 100 MG PO CAPS: 100.0000 mg | ORAL_CAPSULE | Freq: Three times a day (TID) | ORAL | 0 refills | Status: DC

## 2020-03-28 MED ORDER — DEXAMETHASONE 4 MG PO TABS: 4.0000 mg | ORAL_TABLET | Freq: Every day | ORAL | 0 refills | Status: DC

## 2020-03-28 NOTE — Discharge Instructions (Signed)
COVID testing ordered.  It will take between 2-7 days for test results.  Someone will contact you regarding abnormal results.    In the meantime: You should remain isolated in your home for 10 days from symptom onset AND greater than 24 hours after symptoms resolution (absence of fever without the use of fever-reducing medication and improvement in respiratory symptoms), whichever is longer Get plenty of rest and push fluids Tessalon Perles prescribed for cough Flonase for nasal congestion and runny nose Decadron was prescribed Azithromycin was prescribed for possible sinus infection.  If symptom does not improve please rule out Covid via PCR test Use medications daily for symptom relief Use OTC medications like ibuprofen or tylenol as needed fever or pain Call or go to the ED if you have any new or worsening symptoms such as fever, worsening cough, shortness of breath, chest tightness, chest pain, turning blue, changes in mental status, etc..Marland Kitchen

## 2020-03-28 NOTE — ED Provider Notes (Addendum)
[facility]   [id number] 03/28/20 Arrival Time: 0904  Chief Complaint  Patient presents with  . URI     SUBJECTIVE: History from: patient.  Theresa Watts is a 29 y.o. female who p presented to the urgent care for complaint of cough, nasal congestion, sinus pressure, and bilateral ear  pain for the past 4 days.  Denies sick exposure to COVID, flu or strep.  Denies recent travel.  Has tried OTC medication without relief.  Denies aggravating factors.  Denies previous symptoms in the past.   Denies fever, chills, fatigue, sinus pain, rhinorrhea, sore throat, SOB, wheezing, chest pain, nausea, changes in bowel or bladder habits.     ROS: As per HPI.  All other pertinent ROS negative.     Past Medical History:  Diagnosis Date  . Medical history non-contributory    Past Surgical History:  Procedure Laterality Date  . NO PAST SURGERIES     Allergies  Allergen Reactions  . Celebrex [Celecoxib] Hives   Current Facility-Administered Medications on File Prior to Encounter  Medication Dose Route Frequency Provider Last Rate Last Admin  . medroxyPROGESTERone (DEPO-PROVERA) injection 150 mg  150 mg Intramuscular Q90 days Remus Loffler, PA-C   150 mg at 01/24/20 1559   Current Outpatient Medications on File Prior to Encounter  Medication Sig Dispense Refill  . busPIRone (BUSPAR) 7.5 MG tablet TAKE 1 TABLET (7.5 MG TOTAL) BY MOUTH 3 (THREE) TIMES DAILY AS NEEDED (FOR ANXIETY). 270 tablet 1  . DULoxetine (CYMBALTA) 30 MG capsule PLEASE SEE ATTACHED FOR DETAILED DIRECTIONS 180 capsule 1  . medroxyPROGESTERone (DEPO-PROVERA) 150 MG/ML injection Inject 150 mg into the muscle every 3 (three) months.    . ondansetron (ZOFRAN ODT) 4 MG disintegrating tablet Take 1 tablet (4 mg total) by mouth every 8 (eight) hours as needed for nausea or vomiting. 20 tablet 0   Social History   Socioeconomic History  . Marital status: Single    Spouse name: Not on file  . Number of children:  Not on file  . Years of education: Not on file  . Highest education level: Not on file  Occupational History  . Not on file  Tobacco Use  . Smoking status: Never Smoker  . Smokeless tobacco: Never Used  Vaping Use  . Vaping Use: Never used  Substance and Sexual Activity  . Alcohol use: No  . Drug use: No  . Sexual activity: Yes  Other Topics Concern  . Not on file  Social History Narrative  . Not on file   Social Determinants of Health   Financial Resource Strain: Not on file  Food Insecurity: Not on file  Transportation Needs: Not on file  Physical Activity: Not on file  Stress: Not on file  Social Connections: Not on file  Intimate Partner Violence: Not on file   Family History  Problem Relation Age of Onset  . Healthy Mother   . Healthy Father     OBJECTIVE:  Vitals:   03/28/20 0929 03/28/20 0930  BP:  121/81  Pulse:  (!) 108  Resp:  17  Temp:  98.5 F (36.9 C)  TempSrc:  Oral  SpO2:  98%  Weight: 135 lb (61.2 kg)   Height: 5\' 3"  (1.6 m)      General appearance: alert; appears fatigued, but nontoxic; speaking in full sentences and tolerating own secretions HEENT: NCAT; Ears: EACs clear, TMs pearly gray; Eyes: PERRL.  EOM grossly intact. Sinuses: nontender; Nose: nares patent  without rhinorrhea, Throat: oropharynx clear, tonsils non erythematous or enlarged, uvula midline  Neck: supple without LAD Lungs: unlabored respirations, symmetrical air entry; cough: moderate; no respiratory distress; CTAB Heart: regular rate and rhythm.  Radial pulses 2+ symmetrical bilaterally Skin: warm and dry Psychological: alert and cooperative; normal mood and affect  LABS:  No results found for this or any previous visit (from the past 24 hour(s)).   ASSESSMENT & PLAN:  1. URI with cough and congestion   2. Sinus pressure   3. Encounter for screening for COVID-19     Meds ordered this encounter  Medications  . benzonatate (TESSALON) 100 MG capsule    Sig: Take 1  capsule (100 mg total) by mouth every 8 (eight) hours.    Dispense:  30 capsule    Refill:  0  . fluticasone (FLONASE) 50 MCG/ACT nasal spray    Sig: Place 1 spray into both nostrils daily for 14 days.    Dispense:  16 g    Refill:  0  . dexamethasone (DECADRON) 4 MG tablet    Sig: Take 1 tablet (4 mg total) by mouth daily for 7 days.    Dispense:  7 tablet    Refill:  0  . azithromycin (ZITHROMAX) 250 MG tablet    Sig: Take 1 tablet (250 mg total) by mouth daily. Take first 2 tablets together, then 1 every day until finished.    Dispense:  6 tablet    Refill:  0    Discharge instructions  COVID testing ordered.  It will take between 2-7 days for test results.  Someone will contact you regarding abnormal results.    In the meantime: You should remain isolated in your home for 10 days from symptom onset AND greater than 24 hours after symptoms resolution (absence of fever without the use of fever-reducing medication and improvement in respiratory symptoms), whichever is longer Get plenty of rest and push fluids Tessalon Perles prescribed for cough Flonase for nasal congestion and runny nose Decadron was prescribed Azithromycin was prescribed for possible sinus infection.  If symptom does not improve please rule out Covid via PCR test Use medications daily for symptom relief Use OTC medications like ibuprofen or tylenol as needed fever or pain Call or go to the ED if you have any new or worsening symptoms such as fever, worsening cough, shortness of breath, chest tightness, chest pain, turning blue, changes in mental status, etc...   Reviewed expectations re: course of current medical issues. Questions answered. Outlined signs and symptoms indicating need for more acute intervention. Patient verbalized understanding. After Visit Summary given.         Durward Parcel, FNP 03/28/20 0953    Durward Parcel, FNP 03/28/20 [phone]

## 2020-03-28 NOTE — ED Triage Notes (Signed)
Nasal congestion, sinus pressure, cough and bilateral ear pain since Monday.  Had neg covid test on Tuesday from work.

## 2020-03-30 LAB — NOVEL CORONAVIRUS, NAA: SARS-CoV-2, NAA: NOT DETECTED

## 2020-03-30 LAB — SARS-COV-2, NAA 2 DAY TAT

## 2020-04-04 ENCOUNTER — Encounter: Payer: Self-pay | Admitting: Family Medicine

## 2020-04-04 ENCOUNTER — Other Ambulatory Visit: Payer: Self-pay

## 2020-04-04 ENCOUNTER — Ambulatory Visit (INDEPENDENT_AMBULATORY_CARE_PROVIDER_SITE_OTHER): Payer: 59 | Admitting: Family Medicine

## 2020-04-04 VITALS — BP 127/84 | HR 82 | Temp 98.8°F | Ht 63.0 in | Wt 141.4 lb

## 2020-04-04 DIAGNOSIS — F339 Major depressive disorder, recurrent, unspecified: Secondary | ICD-10-CM

## 2020-04-04 DIAGNOSIS — F411 Generalized anxiety disorder: Secondary | ICD-10-CM

## 2020-04-04 DIAGNOSIS — E042 Nontoxic multinodular goiter: Secondary | ICD-10-CM | POA: Diagnosis not present

## 2020-04-04 DIAGNOSIS — Z0001 Encounter for general adult medical examination with abnormal findings: Secondary | ICD-10-CM

## 2020-04-04 DIAGNOSIS — Z0000 Encounter for general adult medical examination without abnormal findings: Secondary | ICD-10-CM

## 2020-04-04 DIAGNOSIS — Z79899 Other long term (current) drug therapy: Secondary | ICD-10-CM

## 2020-04-04 DIAGNOSIS — F5101 Primary insomnia: Secondary | ICD-10-CM

## 2020-04-04 MED ORDER — DULOXETINE HCL 60 MG PO CPEP: 60.0000 mg | ORAL_CAPSULE | Freq: Every day | ORAL | 2 refills | Status: DC

## 2020-04-04 MED ORDER — BUPROPION HCL ER (XL) 150 MG PO TB24: 150.0000 mg | ORAL_TABLET | Freq: Every day | ORAL | 2 refills | Status: DC

## 2020-04-04 MED ORDER — BUSPIRONE HCL 7.5 MG PO TABS: 7.5000 mg | ORAL_TABLET | Freq: Three times a day (TID) | ORAL | 1 refills | Status: DC | PRN

## 2020-04-04 MED ORDER — ZOLPIDEM TARTRATE ER 6.25 MG PO TBCR: 6.2500 mg | EXTENDED_RELEASE_TABLET | Freq: Every evening | ORAL | 2 refills | Status: DC | PRN

## 2020-04-04 NOTE — Progress Notes (Signed)
Theresa Watts is a 29 y.o. female presents to office today for annual physical exam examination. She had fasting lab work this morning.   Concerns today include: 1. Anxiety/depression She is currently taking Cymbalta 60 mg daily in the evening. She had been taking 90 mg for about 2 weeks and noticed a big improvement. However she ran out for about 2 weeks before she was able to get another relief. She has been taking 60 mg daily since. She does report decreased libido but denies other side effects. She generally takes buspar once a day.   GAD 7 : Generalized Anxiety Score 04/04/2020 01/24/2020 03/15/2019 01/26/2019  Nervous, Anxious, on Edge '1 2 3 3  ' Control/stop worrying '1 2 3 3  ' Worry too much - different things '1 2 3 3  ' Trouble relaxing '3 2 3 2  ' Restless 0 1 0 0  Easily annoyed or irritable '1 3 3 2  ' Afraid - awful might happen 0 '1 2 1  ' Total GAD 7 Score '7 13 17 14  ' Anxiety Difficulty Somewhat difficult Somewhat difficult Very difficult -    Depression screen [facility] 2/9 04/04/2020 01/24/2020 03/15/2019  Decreased Interest '1 3 2  ' Down, Depressed, Hopeless '1 3 2  ' PHQ - 2 Score '2 6 4  ' Altered sleeping '3 3 2  ' Tired, decreased energy '3 3 2  ' Change in appetite '1 2 2  ' Feeling bad or failure about yourself  0 2 3  Trouble concentrating '1 2 2  ' Moving slowly or fidgety/restless 0 1 2  Suicidal thoughts 0 0 0  PHQ-9 Score '10 19 17  ' Difficult doing work/chores Somewhat difficult Not difficult at all Very difficult    2. Insomnia She is currently taking Trazodone 100 mg if she doesn't have to work to the next morning. When she has to work the next morning she take Trazodone 50 mg. She reports no improvement with Cymbalta. She has trouble falling asleep and staying asleep. She feels groggy after taking Trazodone. She reports that she still tosses and turns and wakes frequently with Trazodone. She never feels well rested. She has tried and  failed melatonin in the past.   Occupation: Therapist, sports,  Marital status: single, Substance use: denies Diet: "fair", eats a lot of protein, "doesn't eat a lot", Exercise: is on her feet for 12 hours at work Last eye exam: none Last dental exam: every 6 months Last pap smear: last in 2017, goes to [facility]   Past Medical History:  Diagnosis Date  . Medical history non-contributory    Social History   Socioeconomic History  . Marital status: Single    Spouse name: Not on file  . Number of children: Not on file  . Years of education: Not on file  . Highest education level: Not on file  Occupational History  . Not on file  Tobacco Use  . Smoking status: Never Smoker  . Smokeless tobacco: Never Used  Vaping Use  . Vaping Use: Never used  Substance and Sexual Activity  . Alcohol use: No  . Drug use: No  . Sexual activity: Yes  Other Topics Concern  . Not on file  Social History Narrative  . Not on file   Social Determinants of Health   Financial Resource Strain: Not on file  Food Insecurity: Not on file  Transportation Needs: Not on file  Physical Activity: Not on file  Stress: Not on file  Social Connections: Not on file  Intimate Partner  Violence: Not on file   Past Surgical History:  Procedure Laterality Date  . NO PAST SURGERIES     Family History  Problem Relation Age of Onset  . Healthy Mother   . Healthy Father     Current Outpatient Medications:  .  busPIRone (BUSPAR) 7.5 MG tablet, TAKE 1 TABLET (7.5 MG TOTAL) BY MOUTH 3 (THREE) TIMES DAILY AS NEEDED (FOR ANXIETY)., Disp: 270 tablet, Rfl: 1 .  DULoxetine (CYMBALTA) 30 MG capsule, PLEASE SEE ATTACHED FOR DETAILED DIRECTIONS, Disp: 180 capsule, Rfl: 1 .  medroxyPROGESTERone (DEPO-PROVERA) 150 MG/ML injection, Inject 150 mg into the muscle every 3 (three) months., Disp: , Rfl:   Current Facility-Administered Medications:  .  medroxyPROGESTERone (DEPO-PROVERA) injection 150 mg, 150 mg, Intramuscular, Q90 days, Particia Nearing S, PA-C, 150 mg at 01/24/20  1559  Allergies  Allergen Reactions  . Celebrex [Celecoxib] Hives     ROS: Review of Systems Pertinent items noted in HPI and remainder of comprehensive ROS otherwise negative.    Physical exam General appearance: alert and cooperative Head: Normocephalic, without obvious abnormality, atraumatic Eyes: conjunctivae/corneas clear. PERRL, EOM's intact. Ears: normal TM's and external ear canals both ears Nose: Nares normal. Septum midline. Mucosa normal. No drainage or sinus tenderness. Throat: lips, mucosa, and tongue normal; teeth and gums normal Neck: no adenopathy, no carotid bruit, no JVD, supple, symmetrical, trachea midline and thyroid: enlarged Lungs: clear to auscultation bilaterally Heart: regular rate and rhythm, S1, S2 normal, no murmur, click, rub or gallop Abdomen: soft, non-tender; bowel sounds normal; no masses,  no organomegaly Extremities: extremities normal, atraumatic, no cyanosis or edema Skin: Skin color, texture, turgor normal. No rashes or lesions Neurologic: Alert and oriented X 3, normal strength and tone. Normal symmetric reflexes. Normal coordination and gait    Assessment/ Plan: Theresa Watts here for annual physical exam.   Theresa Watts was seen today for annual exam.  Diagnoses and all orders for this visit:  Routine general medical examination at a health care facility Labs pending as below.  -     CMP14+EGFR -     CBC with Differential/Platelet -     Lipid panel -     Vitamin B12 -     VITAMIN D 25 Hydroxy (Vit-D Deficiency, Fractures) -     Thyroid Panel With TSH  Depression, recurrent (HCC)/GAD (generalized anxiety disorder) Improving. Continue Cymbalta 60 mg daily. Add Wellbutrin 150 mg daily. Continue buspar as needed. Follow up in new or worsening symptoms, or if symptoms do not improve.  -     buPROPion (WELLBUTRIN XL) 150 MG 24 hr tablet; Take 1 tablet (150 mg total) by mouth daily. -     DULoxetine (CYMBALTA) 60 MG capsule; Take 1  capsule (60 mg total) by mouth daily. -     busPIRone (BUSPAR) 7.5 MG tablet; Take 1 tablet (7.5 mg total) by mouth 3 (three) times daily as needed (for anxiety).  Multinodular goiter Managed by endo. Will check thyroid panel today.  Primary insomnia Try Ambien.  -     zolpidem (AMBIEN CR) 6.25 MG CR tablet; Take 1 tablet (6.25 mg total) by mouth at bedtime as needed for sleep.  Controlled substance agreement signed -     ToxASSURE Select 13 (MW), Urine  Healthcare maintenance Encourage to schedule appointment with ob/gyn for overdue PAP.   Counseled on healthy lifestyle choices, including diet (rich in fruits, vegetables and lean meats and low in salt and simple carbohydrates) and exercise (at least 30  minutes of moderate physical activity daily).  Patient to follow up in 1 year for annual exam. Return in 3 months for chronic follow up, sooner if needed.   The above assessment and management plan was discussed with the patient. The patient verbalized understanding of and has agreed to the management plan. Patient is aware to call the clinic if symptoms persist or worsen. Patient is aware when to return to the clinic for a follow-up visit. Patient educated on when it is appropriate to go to the emergency department.   Marjorie Smolder, FNP-C [facility] [address] [place], [place] [address] [phone]

## 2020-04-04 NOTE — Patient Instructions (Signed)
Health Maintenance, Female Adopting a healthy lifestyle and getting preventive care are important in promoting health and wellness. Ask your health care provider about:  The right schedule for you to have regular tests and exams.  Things you can do on your own to prevent diseases and keep yourself healthy. What should I know about diet, weight, and exercise? Eat a healthy diet   Eat a diet that includes plenty of vegetables, fruits, low-fat dairy products, and lean protein.  Do not eat a lot of foods that are high in solid fats, added sugars, or sodium. Maintain a healthy weight Body mass index (BMI) is used to identify weight problems. It estimates body fat based on height and weight. Your health care provider can help determine your BMI and help you achieve or maintain a healthy weight. Get regular exercise Get regular exercise. This is one of the most important things you can do for your health. Most adults should:  Exercise for at least 150 minutes each week. The exercise should increase your heart rate and make you sweat (moderate-intensity exercise).  Do strengthening exercises at least twice a week. This is in addition to the moderate-intensity exercise.  Spend less time sitting. Even light physical activity can be beneficial. Watch cholesterol and blood lipids Have your blood tested for lipids and cholesterol at 31 years of age, then have this test every 5 years. Have your cholesterol levels checked more often if:  Your lipid or cholesterol levels are high.  You are older than 31 years of age.  You are at high risk for heart disease. What should I know about cancer screening? Depending on your health history and family history, you may need to have cancer screening at various ages. This may include screening for:  Breast cancer.  Cervical cancer.  Colorectal cancer.  Skin cancer.  Lung cancer. What should I know about heart disease, diabetes, and high blood  pressure? Blood pressure and heart disease  High blood pressure causes heart disease and increases the risk of stroke. This is more likely to develop in people who have high blood pressure readings, are of African descent, or are overweight.  Have your blood pressure checked: ? Every 3-5 years if you are 18-39 years of age. ? Every year if you are 40 years old or older. Diabetes Have regular diabetes screenings. This checks your fasting blood sugar level. Have the screening done:  Once every three years after age 40 if you are at a normal weight and have a low risk for diabetes.  More often and at a younger age if you are overweight or have a high risk for diabetes. What should I know about preventing infection? Hepatitis B If you have a higher risk for hepatitis B, you should be screened for this virus. Talk with your health care provider to find out if you are at risk for hepatitis B infection. Hepatitis C Testing is recommended for:  Everyone born from 1945 through 1965.  Anyone with known risk factors for hepatitis C. Sexually transmitted infections (STIs)  Get screened for STIs, including gonorrhea and chlamydia, if: ? You are sexually active and are younger than 31 years of age. ? You are older than 31 years of age and your health care provider tells you that you are at risk for this type of infection. ? Your sexual activity has changed since you were last screened, and you are at increased risk for chlamydia or gonorrhea. Ask your health care provider if   you are at risk.  Ask your health care provider about whether you are at high risk for HIV. Your health care provider may recommend a prescription medicine to help prevent HIV infection. If you choose to take medicine to prevent HIV, you should first get tested for HIV. You should then be tested every 3 months for as long as you are taking the medicine. Pregnancy  If you are about to stop having your period (premenopausal) and  you may become pregnant, seek counseling before you get pregnant.  Take 400 to 800 micrograms (mcg) of folic acid every day if you become pregnant.  Ask for birth control (contraception) if you want to prevent pregnancy. Osteoporosis and menopause Osteoporosis is a disease in which the bones lose minerals and strength with aging. This can result in bone fractures. If you are 65 years old or older, or if you are at risk for osteoporosis and fractures, ask your health care provider if you should:  Be screened for bone loss.  Take a calcium or vitamin D supplement to lower your risk of fractures.  Be given hormone replacement therapy (HRT) to treat symptoms of menopause. Follow these instructions at home: Lifestyle  Do not use any products that contain nicotine or tobacco, such as cigarettes, e-cigarettes, and chewing tobacco. If you need help quitting, ask your health care provider.  Do not use street drugs.  Do not share needles.  Ask your health care provider for help if you need support or information about quitting drugs. Alcohol use  Do not drink alcohol if: ? Your health care provider tells you not to drink. ? You are pregnant, may be pregnant, or are planning to become pregnant.  If you drink alcohol: ? Limit how much you use to 0-1 drink a day. ? Limit intake if you are breastfeeding.  Be aware of how much alcohol is in your drink. In the [place] one drink equals one 12 oz bottle of beer (355 mL), one 5 oz glass of wine (148 mL), or one 1 oz glass of hard liquor (44 mL). General instructions  Schedule regular health, dental, and eye exams.  Stay current with your vaccines.  Tell your health care provider if: ? You often feel depressed. ? You have ever been abused or do not feel safe at home. Summary  Adopting a healthy lifestyle and getting preventive care are important in promoting health and wellness.  Follow your health care provider's instructions about healthy  diet, exercising, and getting tested or screened for diseases.  Follow your health care provider's instructions on monitoring your cholesterol and blood pressure. This information is not intended to replace advice given to you by your health care provider. Make sure you discuss any questions you have with your health care provider. Document Revised: 03/30/2018 Document Reviewed: 03/30/2018 Elsevier Patient Education  2020 Elsevier Inc.     Why follow it? Research shows. . Those who follow the Mediterranean diet have a reduced risk of heart disease  . The diet is associated with a reduced incidence of Parkinson's and Alzheimer's diseases . People following the diet may have longer life expectancies and lower rates of chronic diseases  . The Dietary Guidelines for Americans recommends the Mediterranean diet as an eating plan to promote health and prevent disease  What Is the Mediterranean Diet?  . Healthy eating plan based on typical foods and recipes of Mediterranean-style cooking . The diet is primarily a plant based diet; these foods should make up   a majority of meals   Starches - Plant based foods should make up a majority of meals - They are an important sources of vitamins, minerals, energy, antioxidants, and fiber - Choose whole grains, foods high in fiber and minimally processed items  - Typical grain sources include wheat, oats, barley, corn, brown rice, bulgar, farro, millet, polenta, couscous  - Various types of beans include chickpeas, lentils, fava beans, black beans, white beans   Fruits  Veggies - Large quantities of antioxidant rich fruits & veggies; 6 or more servings  - Vegetables can be eaten raw or lightly drizzled with oil and cooked  - Vegetables common to the traditional Mediterranean Diet include: artichokes, arugula, beets, broccoli, brussel sprouts, cabbage, carrots, celery, collard greens, cucumbers, eggplant, kale, leeks, lemons, lettuce, mushrooms, okra, onions,  peas, peppers, potatoes, pumpkin, radishes, rutabaga, shallots, spinach, sweet potatoes, turnips, zucchini - Fruits common to the Mediterranean Diet include: apples, apricots, avocados, cherries, clementines, dates, figs, grapefruits, grapes, melons, nectarines, oranges, peaches, pears, pomegranates, strawberries, tangerines  Fats - Replace butter and margarine with healthy oils, such as olive oil, canola oil, and tahini  - Limit nuts to no more than a handful a day  - Nuts include walnuts, almonds, pecans, pistachios, pine nuts  - Limit or avoid candied, honey roasted or heavily salted nuts - Olives are central to the Mediterranean diet - can be eaten whole or used in a variety of dishes   Meats Protein - Limiting red meat: no more than a few times a month - When eating red meat: choose lean cuts and keep the portion to the size of deck of cards - Eggs: approx. 0 to 4 times a week  - Fish and lean poultry: at least 2 a week  - Healthy protein sources include, chicken, turkey, lean beef, lamb - Increase intake of seafood such as tuna, salmon, trout, mackerel, shrimp, scallops - Avoid or limit high fat processed meats such as sausage and bacon  Dairy - Include moderate amounts of low fat dairy products  - Focus on healthy dairy such as fat free yogurt, skim milk, low or reduced fat cheese - Limit dairy products higher in fat such as whole or 2% milk, cheese, ice cream  Alcohol - Moderate amounts of red wine is ok  - No more than 5 oz daily for women (all ages) and men older than age 65  - No more than 10 oz of wine daily for men younger than 65  Other - Limit sweets and other desserts  - Use herbs and spices instead of salt to flavor foods  - Herbs and spices common to the traditional Mediterranean Diet include: basil, bay leaves, chives, cloves, cumin, fennel, garlic, lavender, marjoram, mint, oregano, parsley, pepper, rosemary, sage, savory, sumac, tarragon, thyme   It's not just a diet,  it's a lifestyle:  . The Mediterranean diet includes lifestyle factors typical of those in the region  . Foods, drinks and meals are best eaten with others and savored . Daily physical activity is important for overall good health . This could be strenuous exercise like running and aerobics . This could also be more leisurely activities such as walking, housework, yard-work, or taking the stairs . Moderation is the key; a balanced and healthy diet accommodates most foods and drinks . Consider portion sizes and frequency of consumption of certain foods   Meal Ideas & Options:  . Breakfast:  o Whole wheat toast or whole wheat English muffins with   peanut butter & hard boiled egg o Steel cut oats topped with apples & cinnamon and skim milk  o Fresh fruit: banana, strawberries, melon, berries, peaches  o Smoothies: strawberries, bananas, greek yogurt, peanut butter o Low fat greek yogurt with blueberries and granola  o Egg white omelet with spinach and mushrooms o Breakfast couscous: whole wheat couscous, apricots, skim milk, cranberries  . Sandwiches:  o Hummus and grilled vegetables (peppers, zucchini, squash) on whole wheat bread   o Grilled chicken on whole wheat pita with lettuce, tomatoes, cucumbers or tzatziki  o Tuna salad on whole wheat bread: tuna salad made with greek yogurt, olives, red peppers, capers, green onions o Garlic rosemary lamb pita: lamb sauted with garlic, rosemary, salt & pepper; add lettuce, cucumber, greek yogurt to pita - flavor with lemon juice and black pepper  . Seafood:  o Mediterranean grilled salmon, seasoned with garlic, basil, parsley, lemon juice and black pepper o Shrimp, lemon, and spinach whole-grain pasta salad made with low fat greek yogurt  o Seared scallops with lemon orzo  o Seared tuna steaks seasoned salt, pepper, coriander topped with tomato mixture of olives, tomatoes, olive oil, minced garlic, parsley, green onions and cappers  . Meats:   o Herbed greek chicken salad with kalamata olives, cucumber, feta  o Red bell peppers stuffed with spinach, bulgur, lean ground beef (or lentils) & topped with feta   o Kebabs: skewers of chicken, tomatoes, onions, zucchini, squash  o Turkey burgers: made with red onions, mint, dill, lemon juice, feta cheese topped with roasted red peppers . Vegetarian o Cucumber salad: cucumbers, artichoke hearts, celery, red onion, feta cheese, tossed in olive oil & lemon juice  o Hummus and whole grain pita points with a greek salad (lettuce, tomato, feta, olives, cucumbers, red onion) o Lentil soup with celery, carrots made with vegetable broth, garlic, salt and pepper  o Tabouli salad: parsley, bulgur, mint, scallions, cucumbers, tomato, radishes, lemon juice, olive oil, salt and pepper.      

## 2020-04-05 ENCOUNTER — Other Ambulatory Visit: Payer: Self-pay | Admitting: Family Medicine

## 2020-04-05 DIAGNOSIS — E559 Vitamin D deficiency, unspecified: Secondary | ICD-10-CM

## 2020-04-05 LAB — CBC WITH DIFFERENTIAL/PLATELET
Basophils Absolute: 0 10*3/uL (ref 0.0–0.2)
Basos: 0 %
EOS (ABSOLUTE): 0.1 10*3/uL (ref 0.0–0.4)
Eos: 1 %
Hematocrit: 42.5 % (ref 34.0–46.6)
Hemoglobin: 14.4 g/dL (ref 11.1–15.9)
Immature Grans (Abs): 0 10*3/uL (ref 0.0–0.1)
Immature Granulocytes: 0 %
Lymphocytes Absolute: 2.5 10*3/uL (ref 0.7–3.1)
Lymphs: 35 %
MCH: 31.8 pg (ref 26.6–33.0)
MCHC: 33.9 g/dL (ref 31.5–35.7)
MCV: 94 fL (ref 79–97)
Monocytes Absolute: 0.4 10*3/uL (ref 0.1–0.9)
Monocytes: 6 %
Neutrophils Absolute: 4.1 10*3/uL (ref 1.4–7.0)
Neutrophils: 58 %
Platelets: 316 10*3/uL (ref 150–450)
RBC: 4.53 x10E6/uL (ref 3.77–5.28)
RDW: 12 % (ref 11.7–15.4)
WBC: 7.1 10*3/uL (ref 3.4–10.8)

## 2020-04-05 LAB — LIPID PANEL
Chol/HDL Ratio: 5.3 ratio — ABNORMAL HIGH (ref 0.0–4.4)
Cholesterol, Total: 206 mg/dL — ABNORMAL HIGH (ref 100–199)
HDL: 39 mg/dL — ABNORMAL LOW
LDL Chol Calc (NIH): 150 mg/dL — ABNORMAL HIGH (ref 0–99)
Triglycerides: 94 mg/dL (ref 0–149)
VLDL Cholesterol Cal: 17 mg/dL (ref 5–40)

## 2020-04-05 LAB — CMP14+EGFR
ALT: 16 IU/L (ref 0–32)
AST: 13 IU/L (ref 0–40)
Albumin/Globulin Ratio: 1.9 (ref 1.2–2.2)
Albumin: 4.5 g/dL (ref 3.8–4.8)
Alkaline Phosphatase: 80 IU/L (ref 44–121)
BUN/Creatinine Ratio: 13 (ref 9–23)
BUN: 10 mg/dL (ref 6–20)
Bilirubin Total: 1 mg/dL (ref 0.0–1.2)
CO2: 23 mmol/L (ref 20–29)
Calcium: 9.2 mg/dL (ref 8.7–10.2)
Chloride: 102 mmol/L (ref 96–106)
Creatinine, Ser: 0.8 mg/dL (ref 0.57–1.00)
GFR calc Af Amer: 114 mL/min/{1.73_m2} (ref >59)
GFR calc non Af Amer: 99 mL/min/{1.73_m2} (ref >59)
Globulin, Total: 2.4 g/dL (ref 1.5–4.5)
Glucose: 82 mg/dL (ref 65–99)
Potassium: 4.1 mmol/L (ref 3.5–5.2)
Sodium: 139 mmol/L (ref 134–144)
Total Protein: 6.9 g/dL (ref 6.0–8.5)

## 2020-04-05 LAB — THYROID PANEL WITH TSH
Free Thyroxine Index: 1.7 (ref 1.2–4.9)
T3 Uptake Ratio: 30 % (ref 24–39)
T4, Total: 5.7 ug/dL (ref 4.5–12.0)
TSH: 0.517 u[IU]/mL (ref 0.450–4.500)

## 2020-04-05 LAB — VITAMIN D 25 HYDROXY (VIT D DEFICIENCY, FRACTURES): Vit D, 25-Hydroxy: 27.1 ng/mL — ABNORMAL LOW (ref 30.0–100.0)

## 2020-04-05 LAB — VITAMIN B12: Vitamin B-12: 370 pg/mL (ref 232–1245)

## 2020-04-05 MED ORDER — VITAMIN D (ERGOCALCIFEROL) 1.25 MG (50000 UNIT) PO CAPS: 50000.0000 [IU] | ORAL_CAPSULE | ORAL | 0 refills | Status: DC

## 2020-04-09 NOTE — Progress Notes (Signed)
Will have patient complete at next appointment.

## 2020-04-12 LAB — TOXASSURE SELECT 13 (MW), URINE

## 2020-05-03 ENCOUNTER — Other Ambulatory Visit: Payer: Self-pay

## 2020-05-03 ENCOUNTER — Telehealth: Payer: Self-pay

## 2020-05-03 ENCOUNTER — Ambulatory Visit (INDEPENDENT_AMBULATORY_CARE_PROVIDER_SITE_OTHER): Payer: Self-pay

## 2020-05-03 DIAGNOSIS — Z3042 Encounter for surveillance of injectable contraceptive: Secondary | ICD-10-CM

## 2020-05-03 LAB — PREGNANCY, URINE: Preg Test, Ur: NEGATIVE

## 2020-05-03 MED ORDER — MEDROXYPROGESTERONE ACETATE 150 MG/ML IM SUSP: 150.0000 mg | INTRAMUSCULAR | 0 refills | Status: DC

## 2020-05-03 MED ORDER — MEDROXYPROGESTERONE ACETATE 150 MG/ML IM SUSP: 150.0000 mg | INTRAMUSCULAR | 2 refills | Status: DC

## 2020-05-03 NOTE — Telephone Encounter (Signed)
New prescription sent to [facility], patient informed

## 2020-05-03 NOTE — Progress Notes (Signed)
After negative pregnancy test, Medroxyprogesterone injection to left deltoid per patient request.  Patient tolerated well.

## 2020-06-05 ENCOUNTER — Ambulatory Visit: Payer: 59 | Admitting: "Endocrinology

## 2020-06-16 ENCOUNTER — Other Ambulatory Visit: Payer: Self-pay | Admitting: Family Medicine

## 2020-07-03 ENCOUNTER — Ambulatory Visit (INDEPENDENT_AMBULATORY_CARE_PROVIDER_SITE_OTHER): Payer: 59 | Admitting: Family Medicine

## 2020-07-03 ENCOUNTER — Encounter: Payer: Self-pay | Admitting: Family Medicine

## 2020-07-03 ENCOUNTER — Other Ambulatory Visit: Payer: Self-pay

## 2020-07-03 VITALS — BP 131/72 | HR 113 | Temp 98.5°F | Ht 63.0 in | Wt 142.4 lb

## 2020-07-03 DIAGNOSIS — F5101 Primary insomnia: Secondary | ICD-10-CM | POA: Diagnosis not present

## 2020-07-03 DIAGNOSIS — F411 Generalized anxiety disorder: Secondary | ICD-10-CM | POA: Diagnosis not present

## 2020-07-03 DIAGNOSIS — L700 Acne vulgaris: Secondary | ICD-10-CM

## 2020-07-03 DIAGNOSIS — F339 Major depressive disorder, recurrent, unspecified: Secondary | ICD-10-CM | POA: Diagnosis not present

## 2020-07-03 DIAGNOSIS — Z79899 Other long term (current) drug therapy: Secondary | ICD-10-CM | POA: Diagnosis not present

## 2020-07-03 MED ORDER — ZOLPIDEM TARTRATE ER 6.25 MG PO TBCR: 6.2500 mg | EXTENDED_RELEASE_TABLET | Freq: Every evening | ORAL | 2 refills | Status: DC | PRN

## 2020-07-03 MED ORDER — HYDROXYZINE HCL 10 MG PO TABS: 10.0000 mg | ORAL_TABLET | Freq: Three times a day (TID) | ORAL | 0 refills | Status: DC | PRN

## 2020-07-03 MED ORDER — DOXYCYCLINE HYCLATE 100 MG PO TABS: 100.0000 mg | ORAL_TABLET | Freq: Every day | ORAL | 0 refills | Status: DC

## 2020-07-03 MED ORDER — BUPROPION HCL ER (XL) 300 MG PO TB24: 300.0000 mg | ORAL_TABLET | Freq: Every day | ORAL | 2 refills | Status: DC

## 2020-07-03 NOTE — Progress Notes (Signed)
Established Patient Office Visit  Subjective:  Patient ID: Theresa Watts, female    DOB: [birth year]  Age: 30 y.o. MRN: [id number]  CC:  Chief Complaint  Patient presents with  . Depression    HPI [place] presents for chronic follow up.   1. Depression/anxiety [name] was started on wellbutrin at her last visit. She reports that she is tolerating this well, however she feels like her symptoms are not controlled. She continues to take Cymbalta. She takes buspar TID but does not notice any improvement with this. She took Ambien for 1 month for insomnia and reported that this is really helpful and that she did not feel as groggy the following day as she does with trazodone. The pharmacy told her that she did have any additional refills after that first month.    Depression screen [facility] 2/9 07/03/2020 04/04/2020 01/24/2020  Decreased Interest 2 1 3   Down, Depressed, Hopeless 2 1 3   PHQ - 2 Score 4 2 6   Altered sleeping 3 3 3   Tired, decreased energy 3 3 3   Change in appetite 2 1 2   Feeling bad or failure about yourself  1 0 2  Trouble concentrating 2 1 2   Moving slowly or fidgety/restless 2 0 1  Suicidal thoughts 0 0 0  PHQ-9 Score 17 10 19   Difficult doing work/chores - Somewhat difficult Not difficult at all   GAD 7 : Generalized Anxiety Score 07/03/2020 04/04/2020 01/24/2020 03/15/2019  Nervous, Anxious, on Edge 3 1 2 3   Control/stop worrying 3 1 2 3   Worry too much - different things 3 1 2 3   Trouble relaxing 3 3 2 3   Restless 2 0 1 0  Easily annoyed or irritable 3 1 3 3   Afraid - awful might happen 0 0 1 2  Total GAD 7 Score 17 7 13 17   Anxiety Difficulty - Somewhat difficult Somewhat difficult Very difficult    2. Acne reports acne on her neck, chest, face, and back. She has had acne for years and was previously taking doxycycline with good results. She has seen dermatology in the past however her current insurance will not cover it.   Past  Medical History:  Diagnosis Date  . Medical history non-contributory     Past Surgical History:  Procedure Laterality Date  . NO PAST SURGERIES      Family History  Problem Relation Age of Onset  . Healthy Mother   . Healthy Father     Social History   Socioeconomic History  . Marital status: Single    Spouse name: Not on file  . Number of children: Not on file  . Years of education: Not on file  . Highest education level: Not on file  Occupational History  . Not on file  Tobacco Use  . Smoking status: Never Smoker  . Smokeless tobacco: Never Used  Vaping Use  . Vaping Use: Never used  Substance and Sexual Activity  . Alcohol use: No  . Drug use: No  . Sexual activity: Yes  Other Topics Concern  . Not on file  Social History Narrative  . Not on file   Social Determinants of Health   Financial Resource Strain: Not on file  Food Insecurity: Not on file  Transportation Needs: Not on file  Physical Activity: Not on file  Stress: Not on file  Social Connections: Not on file  Intimate Partner Violence: Not on file    Outpatient Medications Prior  to Visit  Medication Sig Dispense Refill  . buPROPion (WELLBUTRIN XL) 150 MG 24 hr tablet Take 1 tablet (150 mg total) by mouth daily. 90 tablet 2  . busPIRone (BUSPAR) 7.5 MG tablet Take 1 tablet (7.5 mg total) by mouth 3 (three) times daily as needed (for anxiety). 270 tablet 1  . DULoxetine (CYMBALTA) 60 MG capsule Take 1 capsule (60 mg total) by mouth daily. 90 capsule 2  . medroxyPROGESTERone (DEPO-PROVERA) 150 MG/ML injection Inject 1 mL (150 mg total) into the muscle every 3 (three) months. 1 mL 2  . Vitamin D, Ergocalciferol, (DRISDOL) 1.25 MG (50000 UNIT) CAPS capsule Take 1 capsule (50,000 Units total) by mouth every 7 (seven) days. (Patient not taking: Reported on 07/03/2020) 8 capsule 0  . zolpidem (AMBIEN CR) 6.25 MG CR tablet Take 1 tablet (6.25 mg total) by mouth at bedtime as needed for sleep. (Patient not  taking: Reported on 07/03/2020) 30 tablet 2   No facility-administered medications prior to visit.    Allergies  Allergen Reactions  . Celebrex [Celecoxib] Hives    ROS Review of Systems As per HPI.    Objective:    Physical Exam Vitals and nursing note reviewed.  Constitutional:      General: She is not in acute distress.    Appearance: Normal appearance. She is not ill-appearing, toxic-appearing or diaphoretic.  Cardiovascular:     Rate and Rhythm: Normal rate and regular rhythm.     Heart sounds: Normal heart sounds. No murmur heard.   Pulmonary:     Effort: Pulmonary effort is normal. No respiratory distress.     Breath sounds: Normal breath sounds.  Musculoskeletal:     Right lower leg: No edema.     Left lower leg: No edema.  Skin:    General: Skin is warm and dry.  Neurological:     General: No focal deficit present.     Mental Status: She is alert and oriented to person, place, and time.  Psychiatric:        Mood and Affect: Mood normal.        Behavior: Behavior normal.     BP 131/72   Pulse (!) 113   Temp 98.5 F (36.9 C) (Temporal)   Ht 5\' 3"  (1.6 m)   Wt 142 lb 6 oz (64.6 kg)   BMI 25.22 kg/m  Wt Readings from Last 3 Encounters:  07/03/20 142 lb 6 oz (64.6 kg)  04/04/20 141 lb 6 oz (64.1 kg)  03/28/20 135 lb (61.2 kg)     There are no preventive care reminders to display for this patient.  There are no preventive care reminders to display for this patient.  Lab Results  Component Value Date   TSH 0.517 04/04/2020   Lab Results  Component Value Date   WBC 7.1 04/04/2020   HGB 14.4 04/04/2020   HCT 42.5 04/04/2020   MCV 94 04/04/2020   PLT 316 04/04/2020   Lab Results  Component Value Date   NA 139 04/04/2020   K 4.1 04/04/2020   CO2 23 04/04/2020   GLUCOSE 82 04/04/2020   BUN 10 04/04/2020   CREATININE 0.80 04/04/2020   BILITOT 1.0 04/04/2020   ALKPHOS 80 04/04/2020   AST 13 04/04/2020   ALT 16 04/04/2020   PROT 6.9  04/04/2020   ALBUMIN 4.5 04/04/2020   CALCIUM 9.2 04/04/2020   ANIONGAP 5 10/01/2015   Lab Results  Component Value Date   CHOL 206 (H) 04/04/2020  Lab Results  Component Value Date   HDL 39 (L) 04/04/2020   Lab Results  Component Value Date   LDLCALC 150 (H) 04/04/2020   Lab Results  Component Value Date   TRIG 94 04/04/2020   Lab Results  Component Value Date   CHOLHDL 5.3 (H) 04/04/2020   No results found for: HGBA1C    Assessment & Plan:   [name] was seen today for depression.  Diagnoses and all orders for this visit:  GAD (generalized anxiety disorder) Uncontrolled. Increased Wellbutrin. Continue Cymbalta. Discontinue buspar and try hydroxyzine. Declined psych referral today. Discussed genesight testing, however patient does not wish to pay the likely out of pocket costs.  -     buPROPion (WELLBUTRIN XL) 300 MG 24 hr tablet; Take 1 tablet (300 mg total) by mouth daily. -     hydrOXYzine (ATARAX/VISTARIL) 10 MG tablet; Take 1 tablet (10 mg total) by mouth 3 (three) times daily as needed.  Depression, recurrent (HCC) Uncontrolled. No SI. Increase Wellbutrin. Continue Cymbalta.  -     buPROPion (WELLBUTRIN XL) 300 MG 24 hr tablet; Take 1 tablet (300 mg total) by mouth daily.  Primary insomnia Controlled substance agreement signed PDMP reviewed, no red flags. Refills provided. UTD on UDS and CSA.  -     zolpidem (AMBIEN CR) 6.25 MG CR tablet; Take 1 tablet (6.25 mg total) by mouth at bedtime as needed for sleep.  Acne vulgaris Previously well controlled with doxycycline. Restart daily.  -     doxycycline (VIBRA-TABS) 100 MG tablet; Take 1 tablet (100 mg total) by mouth daily. 1 po bid   Follow-up: Return in about 4 weeks (around 07/31/2020) for medication follow up.   The patient indicates understanding of these issues and agrees with the plan.  [name], FNP

## 2020-07-03 NOTE — Patient Instructions (Signed)

## 2020-07-03 NOTE — Addendum Note (Signed)
Addended by: [name] on: 07/03/2020 04:41 PM   Modules accepted: Orders

## 2020-07-26 ENCOUNTER — Ambulatory Visit: Payer: 59

## 2020-08-05 ENCOUNTER — Encounter: Payer: Self-pay | Admitting: Family Medicine

## 2020-08-05 ENCOUNTER — Ambulatory Visit (INDEPENDENT_AMBULATORY_CARE_PROVIDER_SITE_OTHER): Payer: 59 | Admitting: Family Medicine

## 2020-08-05 DIAGNOSIS — G2581 Restless legs syndrome: Secondary | ICD-10-CM

## 2020-08-05 DIAGNOSIS — F339 Major depressive disorder, recurrent, unspecified: Secondary | ICD-10-CM

## 2020-08-05 DIAGNOSIS — F411 Generalized anxiety disorder: Secondary | ICD-10-CM

## 2020-08-05 MED ORDER — HYDROXYZINE HCL 10 MG PO TABS: 10.0000 mg | ORAL_TABLET | Freq: Three times a day (TID) | ORAL | 2 refills | Status: DC | PRN

## 2020-08-05 MED ORDER — ROPINIROLE HCL 0.25 MG PO TABS: 0.2500 mg | ORAL_TABLET | Freq: Every day | ORAL | 0 refills | Status: DC

## 2020-08-05 NOTE — Progress Notes (Signed)
Virtual Visit  Note Due to COVID-19 pandemic this visit was conducted virtually. This visit type was conducted due to national recommendations for restrictions regarding the COVID-19 Pandemic (e.g. social distancing, sheltering in place) in an effort to limit this patient's exposure and mitigate transmission in our community. All issues noted in this document were discussed and addressed.  A physical exam was not performed with this format.  I connected with Theresa Watts on 08/05/20 at 1547 by telephone and verified that I am speaking with the correct person using two identifiers. Theresa Watts is currently located in her car and no one is currently with her during the visit. The provider, [name], FNP is located in their office at time of visit.  I discussed the limitations, risks, security and privacy concerns of performing an evaluation and management service by telephone and the availability of in person appointments. I also discussed with the patient that there may be a patient responsible charge related to this service. The patient expressed understanding and agreed to proceed.  CC: depression, anxiety follow up  History and Present Illness:  HPI  Theresa Watts reports improved symptoms with the increase in Wellbutrin. She continue to take Cymbalta as well. She is taking hydroxyzine 1-2x a day. She is taking Ambien for sleep. She feels like she is not as groggy the following day with Ambien. She is still having some trouble falling asleep due to restless legs and difficulty getting comfortable due to the constant need to move.   Depression screen [facility] 2/9 08/05/2020 07/03/2020 04/04/2020  Decreased Interest 1 2 1   Down, Depressed, Hopeless 0 2 1  PHQ - 2 Score 1 4 2   Altered sleeping 3 3 3   Tired, decreased energy 2 3 3   Change in appetite 0 2 1  Feeling bad or failure about yourself  0 1 0  Trouble concentrating 1 2 1   Moving slowly or fidgety/restless 0 2 0   Suicidal thoughts 0 0 0  PHQ-9 Score 7 17 10   Difficult doing work/chores - - Somewhat difficult  Some recent data might be hidden   GAD 7 : Generalized Anxiety Score 08/05/2020 07/03/2020 04/04/2020 01/24/2020  Nervous, Anxious, on Edge 1 3 1 2   Control/stop worrying 0 3 1 2   Worry too much - different things 1 3 1 2   Trouble relaxing 0 3 3 2   Restless 0 2 0 1  Easily annoyed or irritable 1 3 1 3   Afraid - awful might happen 0 0 0 1  Total GAD 7 Score 3 17 7 13   Anxiety Difficulty - - Somewhat difficult Somewhat difficult    ROS As per HPI.   Observations/Objective: Alert and oriented x 3. Able to speak in full sentences without difficulty.    Assessment and Plan: was seen today for depression.  Diagnoses and all orders for this visit:  GAD (generalized anxiety disorder) Well controlled on current regimen.  -     hydrOXYzine (ATARAX/VISTARIL) 10 MG tablet; Take 1 tablet (10 mg total) by mouth 3 (three) times daily as needed.  Depression, recurrent (HCC) Improving. Score dropped with 17 to 7 with increase in wellbutrin. Continue current regimen. Will work on sleep hygiene and add requip for RSL as her positive answers today were mostly related to difficulty sleeping.   Restless leg -     rOPINIRole (REQUIP) 0.25 MG tablet; Take 1-2 tablets (0.25-0.5 mg total) by mouth at bedtime.     Follow Up Instructions:  Return in about 2 months (around 10/05/2020) for chronic follow up. Sooner for new or worsening symptoms.     I discussed the assessment and treatment plan with the patient. The patient was provided an opportunity to ask questions and all were answered. The patient agreed with the plan and demonstrated an understanding of the instructions.   The patient was advised to call back or seek an in-person evaluation if the symptoms worsen or if the condition fails to improve as anticipated.  The above assessment and management plan was discussed with the patient.  The patient verbalized understanding of and has agreed to the management plan. Patient is aware to call the clinic if symptoms persist or worsen. Patient is aware when to return to the clinic for a follow-up visit. Patient educated on when it is appropriate to go to the emergency department.   Time call ended:  1602  I provided 17 minutes of  non face-to-face time during this encounter.    [name], FNP

## 2020-08-08 ENCOUNTER — Ambulatory Visit: Payer: 59

## 2020-08-09 ENCOUNTER — Ambulatory Visit (INDEPENDENT_AMBULATORY_CARE_PROVIDER_SITE_OTHER): Payer: 59

## 2020-08-09 ENCOUNTER — Other Ambulatory Visit: Payer: Self-pay

## 2020-08-09 DIAGNOSIS — Z3042 Encounter for surveillance of injectable contraceptive: Secondary | ICD-10-CM | POA: Diagnosis not present

## 2020-08-09 LAB — PREGNANCY, URINE: Preg Test, Ur: NEGATIVE

## 2020-08-09 MED ORDER — MEDROXYPROGESTERONE ACETATE 150 MG/ML IM SUSP
150.0000 mg | Freq: Once | INTRAMUSCULAR | Status: AC
  Administered 2020-08-09: 150 mg via INTRAMUSCULAR

## 2020-08-09 NOTE — Progress Notes (Signed)
Pt tolerated depo well 

## 2020-08-29 ENCOUNTER — Other Ambulatory Visit: Payer: Self-pay | Admitting: Family Medicine

## 2020-08-29 DIAGNOSIS — G2581 Restless legs syndrome: Secondary | ICD-10-CM

## 2020-09-12 ENCOUNTER — Other Ambulatory Visit: Payer: Self-pay | Admitting: Family Medicine

## 2020-09-12 DIAGNOSIS — G2581 Restless legs syndrome: Secondary | ICD-10-CM

## 2020-10-05 ENCOUNTER — Other Ambulatory Visit: Payer: Self-pay | Admitting: Family Medicine

## 2020-10-05 DIAGNOSIS — L700 Acne vulgaris: Secondary | ICD-10-CM

## 2020-10-25 ENCOUNTER — Other Ambulatory Visit: Payer: Self-pay | Admitting: Family Medicine

## 2020-10-25 ENCOUNTER — Other Ambulatory Visit: Payer: Self-pay

## 2020-10-25 ENCOUNTER — Encounter: Payer: Self-pay | Admitting: Family Medicine

## 2020-10-25 ENCOUNTER — Ambulatory Visit (INDEPENDENT_AMBULATORY_CARE_PROVIDER_SITE_OTHER): Payer: 59 | Admitting: Family Medicine

## 2020-10-25 VITALS — BP 134/81 | HR 105 | Temp 98.1°F | Ht 63.0 in | Wt 149.4 lb

## 2020-10-25 DIAGNOSIS — F411 Generalized anxiety disorder: Secondary | ICD-10-CM

## 2020-10-25 DIAGNOSIS — Z3042 Encounter for surveillance of injectable contraceptive: Secondary | ICD-10-CM | POA: Diagnosis not present

## 2020-10-25 DIAGNOSIS — R1013 Epigastric pain: Secondary | ICD-10-CM

## 2020-10-25 DIAGNOSIS — E042 Nontoxic multinodular goiter: Secondary | ICD-10-CM | POA: Diagnosis not present

## 2020-10-25 DIAGNOSIS — F339 Major depressive disorder, recurrent, unspecified: Secondary | ICD-10-CM

## 2020-10-25 MED ORDER — DESVENLAFAXINE SUCCINATE ER 50 MG PO TB24: 50.0000 mg | ORAL_TABLET | Freq: Every day | ORAL | 3 refills | Status: DC

## 2020-10-25 MED ORDER — MEDROXYPROGESTERONE ACETATE 150 MG/ML IM SUSP
150.0000 mg | Freq: Once | INTRAMUSCULAR | Status: AC
  Administered 2020-10-25: 150 mg via INTRAMUSCULAR

## 2020-10-25 MED ORDER — OMEPRAZOLE 20 MG PO CPDR: 20.0000 mg | DELAYED_RELEASE_CAPSULE | Freq: Every day | ORAL | 3 refills | Status: DC

## 2020-10-25 MED ORDER — DESVENLAFAXINE SUCCINATE ER 50 MG PO TB24: ORAL_TABLET | ORAL | 3 refills | Status: DC

## 2020-10-25 NOTE — Progress Notes (Addendum)
Established Patient Office Visit  Subjective:  Patient ID: Theresa Watts, female    DOB: 12-Mar-1991  Age: 30 y.o. MRN: [id number]  CC:  Chief Complaint  Patient presents with   Thyroid Problem    HPI Theresa Watts presents for thyroid follow up.   She reports that she started feeling jittery in the morning a few months ago. She wasn't sure which medication was causing this so she slowly tapered off her medications. She has been off these for about 1 month. She reports feeling jittery still. This happens a few times every week and lasts for about 1 hour. She does feel anxious when this happens.   She has also been gaining weight. She feels bloated after eating. She reports that her stomach hurts in the epigastric area. She sometimes feels nauseous. Denies heartburn, hoarseness, diarrhea, constipation, vomiting, cough, sore throat, or water brash. She has taken gas x without improvement.   She is due for her depo injection today.   Depression screen [facility] 2/9 10/25/2020 08/05/2020 07/03/2020  Decreased Interest '3 1 2  ' Down, Depressed, Hopeless 2 0 2  PHQ - 2 Score '5 1 4  ' Altered sleeping '3 3 3  ' Tired, decreased energy '3 2 3  ' Change in appetite 3 0 2  Feeling bad or failure about yourself  1 0 1  Trouble concentrating '3 1 2  ' Moving slowly or fidgety/restless 0 0 2  Suicidal thoughts 0 0 0  PHQ-9 Score '18 7 17  ' Difficult doing work/chores - - -  Some recent data might be hidden    GAD 7 : Generalized Anxiety Score 10/25/2020 08/05/2020 07/03/2020 04/04/2020  Nervous, Anxious, on Edge '3 1 3 1  ' Control/stop worrying 2 0 3 1  Worry too much - different things '2 1 3 1  ' Trouble relaxing 2 0 3 3  Restless 1 0 2 0  Easily annoyed or irritable '3 1 3 1  ' Afraid - awful might happen 0 0 0 0  Total GAD 7 Score '13 3 17 7  ' Anxiety Difficulty - - - Somewhat difficult      Past Medical History:  Diagnosis Date   Medical history non-contributory     Past Surgical History:   Procedure Laterality Date   NO PAST SURGERIES      Family History  Problem Relation Age of Onset   Healthy Mother    Healthy Father     Social History   Socioeconomic History   Marital status: Single    Spouse name: Not on file   Number of children: Not on file   Years of education: Not on file   Highest education level: Not on file  Occupational History   Not on file  Tobacco Use   Smoking status: Never   Smokeless tobacco: Never  Vaping Use   Vaping Use: Never used  Substance and Sexual Activity   Alcohol use: No   Drug use: No   Sexual activity: Yes  Other Topics Concern   Not on file  Social History Narrative   Not on file   Social Determinants of Health   Financial Resource Strain: Not on file  Food Insecurity: Not on file  Transportation Needs: Not on file  Physical Activity: Not on file  Stress: Not on file  Social Connections: Not on file  Intimate Partner Violence: Not on file    Outpatient Medications Prior to Visit  Medication Sig Dispense Refill   medroxyPROGESTERone (DEPO-PROVERA) 150 MG/ML  injection Inject 1 mL (150 mg total) into the muscle every 3 (three) months. 1 mL 2   buPROPion (WELLBUTRIN XL) 300 MG 24 hr tablet Take 1 tablet (300 mg total) by mouth daily. (Patient not taking: Reported on 10/25/2020) 90 tablet 2   doxycycline (VIBRA-TABS) 100 MG tablet Take 1 tablet (100 mg total) by mouth daily. (Patient not taking: Reported on 10/25/2020) 90 tablet 0   DULoxetine (CYMBALTA) 60 MG capsule Take 1 capsule (60 mg total) by mouth daily. (Patient not taking: Reported on 10/25/2020) 90 capsule 2   hydrOXYzine (ATARAX/VISTARIL) 10 MG tablet Take 1 tablet (10 mg total) by mouth 3 (three) times daily as needed. (Patient not taking: Reported on 10/25/2020) 30 tablet 2   rOPINIRole (REQUIP) 0.25 MG tablet TAKE 1-2 TABLETS (0.25-0.5 MG TOTAL) BY MOUTH AT BEDTIME. (Patient not taking: Reported on 10/25/2020) 180 tablet 0   Vitamin D, Ergocalciferol, (DRISDOL)  1.25 MG (50000 UNIT) CAPS capsule Take 1 capsule (50,000 Units total) by mouth every 7 (seven) days. (Patient not taking: Reported on 10/25/2020) 8 capsule 0   zolpidem (AMBIEN CR) 6.25 MG CR tablet Take 1 tablet (6.25 mg total) by mouth at bedtime as needed for sleep. (Patient not taking: Reported on 10/25/2020) 30 tablet 2   No facility-administered medications prior to visit.    Allergies  Allergen Reactions   Celebrex [Celecoxib] Hives    ROS Review of Systems As per HPI.    Objective:    Physical Exam Vitals and nursing note reviewed.  Constitutional:      General: She is not in acute distress.    Appearance: Normal appearance. She is not ill-appearing, toxic-appearing or diaphoretic.  Neck:     Thyroid: No thyromegaly or thyroid tenderness.  Cardiovascular:     Rate and Rhythm: Normal rate and regular rhythm.     Heart sounds: Normal heart sounds. No murmur heard. Pulmonary:     Effort: Pulmonary effort is normal. No respiratory distress.     Breath sounds: Normal breath sounds.  Abdominal:     General: Bowel sounds are normal. There is no distension.     Palpations: Abdomen is soft.     Tenderness: There is abdominal tenderness in the epigastric area. There is no guarding or rebound. Negative signs include Murphy's sign and McBurney's sign.  Musculoskeletal:     Right lower leg: No edema.     Left lower leg: No edema.  Skin:    General: Skin is warm and dry.  Neurological:     General: No focal deficit present.     Mental Status: She is alert and oriented to person, place, and time.  Psychiatric:        Mood and Affect: Affect is tearful.        Behavior: Behavior normal.    BP 134/81   Pulse (!) 105   Temp 98.1 F (36.7 C)   Ht '5\' 3"'  (1.6 m)   Wt 149 lb 6.4 oz (67.8 kg)   SpO2 100%   BMI 26.47 kg/m  Wt Readings from Last 3 Encounters:  10/25/20 149 lb 6.4 oz (67.8 kg)  07/03/20 142 lb 6 oz (64.6 kg)  04/04/20 141 lb 6 oz (64.1 kg)     Health  Maintenance Due  Topic Date Due   Pneumococcal Vaccine 37-48 Years old (1 - PCV) Never done   COVID-19 Vaccine (3 - Moderna risk series) 07/22/2020    There are no preventive care reminders to display for  this patient.  Lab Results  Component Value Date   TSH 0.517 04/04/2020   Lab Results  Component Value Date   WBC 7.1 04/04/2020   HGB 14.4 04/04/2020   HCT 42.5 04/04/2020   MCV 94 04/04/2020   PLT 316 04/04/2020   Lab Results  Component Value Date   NA 139 04/04/2020   K 4.1 04/04/2020   CO2 23 04/04/2020   GLUCOSE 82 04/04/2020   BUN 10 04/04/2020   CREATININE 0.80 04/04/2020   BILITOT 1.0 04/04/2020   ALKPHOS 80 04/04/2020   AST 13 04/04/2020   ALT 16 04/04/2020   PROT 6.9 04/04/2020   ALBUMIN 4.5 04/04/2020   CALCIUM 9.2 04/04/2020   ANIONGAP 5 10/01/2015   Lab Results  Component Value Date   CHOL 206 (H) 04/04/2020   Lab Results  Component Value Date   HDL 39 (L) 04/04/2020   Lab Results  Component Value Date   LDLCALC 150 (H) 04/04/2020   Lab Results  Component Value Date   TRIG 94 04/04/2020   Lab Results  Component Value Date   CHOLHDL 5.3 (H) 04/04/2020   No results found for: HGBA1C    Assessment & Plan:   Theresa Watts was seen today for thyroid problem.  Diagnoses and all orders for this visit:  GAD (generalized anxiety disorder) Depression, recurrent ([place]) Uncontrolled. No SI. Patient taper off all medication 1 month ago. Start pristiq as below.  -     CBC with Differential/Platelet -     CMP14+EGFR -     Thyroid Panel With TSH -     VITAMIN D 25 Hydroxy (Vit-D Deficiency, Fractures) -     desvenlafaxine (PRISTIQ) 50 MG 24 hr tablet; Take 50 mg daily. May increase to 100 mg after 14 days.  Multinodular goiter Labs pending.  -     Thyroid Panel With TSH  Epigastric pain Try prilosec as below.  -     omeprazole (PRILOSEC) 20 MG capsule; Take 1 capsule (20 mg total) by mouth daily.  Encounter for surveillance of injectable  contraceptive Injection today in offie.  -     medroxyPROGESTERone (DEPO-PROVERA) injection 150 mg  Follow-up: Return in about 4 weeks (around 11/22/2020) for medication follow up.   The patient indicates understanding of these issues and agrees with the plan.  Gwenlyn Perking, FNP

## 2020-10-25 NOTE — Telephone Encounter (Signed)
Patient called to see if Theresa Watts had heard from the pharmacy regarding Rx for Desvenlafaxine.  Pt says her insurance will only cover 1 tablet per day. Need to correct and resend.

## 2020-10-25 NOTE — Addendum Note (Signed)
Addended by: [name] on: 10/25/2020 04:41 PM   Modules accepted: Orders

## 2020-10-25 NOTE — Patient Instructions (Signed)

## 2020-10-26 LAB — CMP14+EGFR
ALT: 35 IU/L — ABNORMAL HIGH (ref 0–32)
AST: 25 IU/L (ref 0–40)
Albumin/Globulin Ratio: 1.7 (ref 1.2–2.2)
Albumin: 4.7 g/dL (ref 3.8–4.8)
Alkaline Phosphatase: 76 IU/L (ref 44–121)
BUN/Creatinine Ratio: 11 (ref 9–23)
BUN: 10 mg/dL (ref 6–20)
Bilirubin Total: 0.9 mg/dL (ref 0.0–1.2)
CO2: 21 mmol/L (ref 20–29)
Calcium: 9.3 mg/dL (ref 8.7–10.2)
Chloride: 103 mmol/L (ref 96–106)
Creatinine, Ser: 0.91 mg/dL (ref 0.57–1.00)
Globulin, Total: 2.7 g/dL (ref 1.5–4.5)
Glucose: 137 mg/dL — ABNORMAL HIGH (ref 65–99)
Potassium: 4.3 mmol/L (ref 3.5–5.2)
Sodium: 139 mmol/L (ref 134–144)
Total Protein: 7.4 g/dL (ref 6.0–8.5)
eGFR: 86 mL/min/{1.73_m2} (ref >59)

## 2020-10-26 LAB — CBC WITH DIFFERENTIAL/PLATELET
Basophils Absolute: 0 10*3/uL (ref 0.0–0.2)
Basos: 1 %
EOS (ABSOLUTE): 0.2 10*3/uL (ref 0.0–0.4)
Eos: 3 %
Hematocrit: 42.5 % (ref 34.0–46.6)
Hemoglobin: 14.7 g/dL (ref 11.1–15.9)
Immature Grans (Abs): 0 10*3/uL (ref 0.0–0.1)
Immature Granulocytes: 0 %
Lymphocytes Absolute: 2 10*3/uL (ref 0.7–3.1)
Lymphs: 30 %
MCH: 32.7 pg (ref 26.6–33.0)
MCHC: 34.6 g/dL (ref 31.5–35.7)
MCV: 94 fL (ref 79–97)
Monocytes Absolute: 0.4 10*3/uL (ref 0.1–0.9)
Monocytes: 6 %
Neutrophils Absolute: 4.1 10*3/uL (ref 1.4–7.0)
Neutrophils: 60 %
Platelets: 254 10*3/uL (ref 150–450)
RBC: 4.5 x10E6/uL (ref 3.77–5.28)
RDW: 12.4 % (ref 11.7–15.4)
WBC: 6.8 10*3/uL (ref 3.4–10.8)

## 2020-10-26 LAB — THYROID PANEL WITH TSH
Free Thyroxine Index: 1.5 (ref 1.2–4.9)
T3 Uptake Ratio: 28 % (ref 24–39)
T4, Total: 5.4 ug/dL (ref 4.5–12.0)
TSH: 0.698 u[IU]/mL (ref 0.450–4.500)

## 2020-10-26 LAB — VITAMIN D 25 HYDROXY (VIT D DEFICIENCY, FRACTURES): Vit D, 25-Hydroxy: 45.4 ng/mL (ref 30.0–100.0)

## 2020-11-17 ENCOUNTER — Other Ambulatory Visit: Payer: Self-pay | Admitting: Family Medicine

## 2020-11-17 DIAGNOSIS — F411 Generalized anxiety disorder: Secondary | ICD-10-CM

## 2020-11-17 DIAGNOSIS — F339 Major depressive disorder, recurrent, unspecified: Secondary | ICD-10-CM

## 2020-11-25 ENCOUNTER — Telehealth: Payer: Self-pay | Admitting: Family Medicine

## 2020-11-25 ENCOUNTER — Ambulatory Visit: Payer: 59 | Admitting: Family Medicine

## 2020-11-25 NOTE — Telephone Encounter (Signed)
Ok to do televisit if she prefers.

## 2020-11-25 NOTE — Telephone Encounter (Signed)
No answer, no voicemail.

## 2020-12-02 NOTE — Telephone Encounter (Signed)
Attempted to contact pt without return call in over 3 days, will close encounter. 

## 2021-01-06 ENCOUNTER — Encounter: Payer: Self-pay | Admitting: Family Medicine

## 2021-01-06 ENCOUNTER — Ambulatory Visit (INDEPENDENT_AMBULATORY_CARE_PROVIDER_SITE_OTHER): Payer: 59 | Admitting: Family Medicine

## 2021-01-06 ENCOUNTER — Other Ambulatory Visit: Payer: Self-pay

## 2021-01-06 VITALS — BP 113/68 | HR 90 | Temp 98.3°F | Ht 63.0 in | Wt 145.4 lb

## 2021-01-06 DIAGNOSIS — K219 Gastro-esophageal reflux disease without esophagitis: Secondary | ICD-10-CM | POA: Diagnosis not present

## 2021-01-06 DIAGNOSIS — F411 Generalized anxiety disorder: Secondary | ICD-10-CM

## 2021-01-06 DIAGNOSIS — F339 Major depressive disorder, recurrent, unspecified: Secondary | ICD-10-CM | POA: Diagnosis not present

## 2021-01-06 MED ORDER — DESVENLAFAXINE SUCCINATE ER 100 MG PO TB24: 100.0000 mg | ORAL_TABLET | Freq: Every day | ORAL | 3 refills | Status: DC

## 2021-01-06 MED ORDER — PANTOPRAZOLE SODIUM 40 MG PO TBEC: 40.0000 mg | DELAYED_RELEASE_TABLET | Freq: Every day | ORAL | 0 refills | Status: DC

## 2021-01-06 NOTE — Progress Notes (Signed)
Established Patient Office Visit  Subjective:  Patient ID: Theresa Watts, female    DOB: 1990/10/02  Age: 30 y.o. MRN: [id number]  CC:  Chief Complaint  Patient presents with   Depression   Anxiety    HPI Theresa Watts presents for follow up of depression and anxiety. She was not able to pick up refills on pristiq, so she has not had this for the last 2 months. She was told by the pharmacy that there was an issue with her insurance. She   She continues to have epigastric pain. She feels bloated and miserable after eating. Denies nausea or vomiting. Denies heartburn or dysphagia. She has been taking prilosec with some improvement  Depression screen [facility] 2/9 01/06/2021 10/25/2020 08/05/2020  Decreased Interest '3 3 1  ' Down, Depressed, Hopeless 2 2 0  PHQ - 2 Score '5 5 1  ' Altered sleeping '2 3 3  ' Tired, decreased energy '3 3 2  ' Change in appetite 2 3 0  Feeling bad or failure about yourself  1 1 0  Trouble concentrating '2 3 1  ' Moving slowly or fidgety/restless 1 0 0  Suicidal thoughts 0 0 0  PHQ-9 Score '16 18 7  ' Difficult doing work/chores Somewhat difficult - -  Some recent data might be hidden   GAD 7 : Generalized Anxiety Score 01/06/2021 10/25/2020 08/05/2020 07/03/2020  Nervous, Anxious, on Edge '3 3 1 3  ' Control/stop worrying 1 2 0 3  Worry too much - different things '1 2 1 3  ' Trouble relaxing 3 2 0 3  Restless 3 1 0 2  Easily annoyed or irritable '3 3 1 3  ' Afraid - awful might happen 0 0 0 0  Total GAD 7 Score '14 13 3 17  ' Anxiety Difficulty Somewhat difficult - - -      Past Medical History:  Diagnosis Date   Medical history non-contributory     Past Surgical History:  Procedure Laterality Date   NO PAST SURGERIES      Family History  Problem Relation Age of Onset   Healthy Mother    Healthy Father     Social History   Socioeconomic History   Marital status: Single    Spouse name: Not on file   Number of children: Not on file   Years of  education: Not on file   Highest education level: Not on file  Occupational History   Not on file  Tobacco Use   Smoking status: Never   Smokeless tobacco: Never  Vaping Use   Vaping Use: Never used  Substance and Sexual Activity   Alcohol use: No   Drug use: No   Sexual activity: Yes  Other Topics Concern   Not on file  Social History Narrative   Not on file   Social Determinants of Health   Financial Resource Strain: Not on file  Food Insecurity: Not on file  Transportation Needs: Not on file  Physical Activity: Not on file  Stress: Not on file  Social Connections: Not on file  Intimate Partner Violence: Not on file    Outpatient Medications Prior to Visit  Medication Sig Dispense Refill   desvenlafaxine (PRISTIQ) 50 MG 24 hr tablet TAKE 1 TABLET BY MOUTH EVERY DAY 90 tablet 0   medroxyPROGESTERone (DEPO-PROVERA) 150 MG/ML injection Inject 1 mL (150 mg total) into the muscle every 3 (three) months. 1 mL 2   omeprazole (PRILOSEC) 20 MG capsule Take 1 capsule (20 mg total) by  mouth daily. 30 capsule 3   doxycycline (VIBRA-TABS) 100 MG tablet Take 1 tablet (100 mg total) by mouth daily. (Patient not taking: Reported on 10/25/2020) 90 tablet 0   hydrOXYzine (ATARAX/VISTARIL) 10 MG tablet Take 1 tablet (10 mg total) by mouth 3 (three) times daily as needed. (Patient not taking: Reported on 10/25/2020) 30 tablet 2   rOPINIRole (REQUIP) 0.25 MG tablet TAKE 1-2 TABLETS (0.25-0.5 MG TOTAL) BY MOUTH AT BEDTIME. (Patient not taking: No sig reported) 180 tablet 0   Vitamin D, Ergocalciferol, (DRISDOL) 1.25 MG (50000 UNIT) CAPS capsule Take 1 capsule (50,000 Units total) by mouth every 7 (seven) days. (Patient not taking: Reported on 10/25/2020) 8 capsule 0   zolpidem (AMBIEN CR) 6.25 MG CR tablet Take 1 tablet (6.25 mg total) by mouth at bedtime as needed for sleep. (Patient not taking: Reported on 10/25/2020) 30 tablet 2   No facility-administered medications prior to visit.    Allergies   Allergen Reactions   Celebrex [Celecoxib] Hives    ROS Review of Systems As per HPI.    Objective:    Physical Exam Vitals and nursing note reviewed.  Constitutional:      General: She is not in acute distress.    Appearance: She is not ill-appearing, toxic-appearing or diaphoretic.  Cardiovascular:     Rate and Rhythm: Normal rate and regular rhythm.     Heart sounds: Normal heart sounds. No murmur heard. Pulmonary:     Effort: Pulmonary effort is normal. No respiratory distress.     Breath sounds: Normal breath sounds.  Abdominal:     General: Bowel sounds are normal. There is no distension.     Palpations: Abdomen is soft.     Tenderness: There is abdominal tenderness in the epigastric area. There is no guarding or rebound.  Musculoskeletal:     Right lower leg: No edema.     Left lower leg: No edema.  Skin:    General: Skin is warm and dry.  Neurological:     General: No focal deficit present.     Mental Status: She is alert and oriented to person, place, and time.  Psychiatric:        Mood and Affect: Mood normal.        Behavior: Behavior normal.        Thought Content: Thought content normal.        Judgment: Judgment normal.    BP 113/68   Pulse 90   Temp 98.3 F (36.8 C) (Temporal)   Ht '5\' 3"'  (1.6 m)   Wt 145 lb 6 oz (65.9 kg)   BMI 25.75 kg/m  Wt Readings from Last 3 Encounters:  01/06/21 145 lb 6 oz (65.9 kg)  10/25/20 149 lb 6.4 oz (67.8 kg)  07/03/20 142 lb 6 oz (64.6 kg)     There are no preventive care reminders to display for this patient.  There are no preventive care reminders to display for this patient.  Lab Results  Component Value Date   TSH 0.698 10/25/2020   Lab Results  Component Value Date   WBC 6.8 10/25/2020   HGB 14.7 10/25/2020   HCT 42.5 10/25/2020   MCV 94 10/25/2020   PLT 254 10/25/2020   Lab Results  Component Value Date   NA 139 10/25/2020   K 4.3 10/25/2020   CO2 21 10/25/2020   GLUCOSE 137 (H)  10/25/2020   BUN 10 10/25/2020   CREATININE 0.91 10/25/2020   BILITOT 0.9  10/25/2020   ALKPHOS 76 10/25/2020   AST 25 10/25/2020   ALT 35 (H) 10/25/2020   PROT 7.4 10/25/2020   ALBUMIN 4.7 10/25/2020   CALCIUM 9.3 10/25/2020   ANIONGAP 5 10/01/2015   EGFR 86 10/25/2020   Lab Results  Component Value Date   CHOL 206 (H) 04/04/2020   Lab Results  Component Value Date   HDL 39 (L) 04/04/2020   Lab Results  Component Value Date   LDLCALC 150 (H) 04/04/2020   Lab Results  Component Value Date   TRIG 94 04/04/2020   Lab Results  Component Value Date   CHOLHDL 5.3 (H) 04/04/2020   No results found for: HGBA1C    Assessment & Plan:   [place] was seen today for depression and anxiety.  Diagnoses and all orders for this visit:  Depression, recurrent ([place]) GAD (generalized anxiety disorder) Both uncontrolled. PHQ 9 score of 16 today. Denies SI. GAD score of 14. Has not had medication for 2 months. Reordered pristiq as below. Discussed to take 1/2 tablet x 1 week, then take 1 tablet daily. She will notify the office if she has any difficulty picking up Rx.  -     desvenlafaxine (PRISTIQ) 100 MG 24 hr tablet; Take 1 tablet (100 mg total) by mouth daily.  Gastroesophageal reflux disease without esophagitis Not well controlled with prilosec. Try protonix as below.  -     pantoprazole (PROTONIX) 40 MG tablet; Take 1 tablet (40 mg total) by mouth daily.   Follow-up: Return in about 6 weeks (around 02/17/2021) for medication follow up, telephone visit is ok.  The patient indicates understanding of these issues and agrees with the plan.    Gwenlyn Perking, FNP

## 2021-01-06 NOTE — Patient Instructions (Signed)
Take 1/2 tablet of Pristiq for 1 week, then take 1 full tablet daily.  Managing Depression, Adult Depression is a mental health condition that affects your thoughts, feelings, and actions. Being diagnosed with depression can bring you relief if you did not know why you have felt or behaved a certain way. It could also leave you feeling overwhelmed with uncertainty about your future. Preparing yourself to manage your symptoms can help you feel more positive about your future. How to manage lifestyle changes Managing stress Stress is your body's reaction to life changes and events, both good and bad. Stress can add to your feelings of depression. Learning to manage your stress can help lessen your feelings of depression. Try some of the following approaches to reducing your stress (stress reduction techniques): Listen to music that you enjoy and that inspires you. Try using a meditation app or take a meditation class. Develop a practice that helps you connect with your spiritual self. Walk in nature, pray, or go to a place of worship. Do some deep breathing. To do this, inhale slowly through your nose. Pause at the top of your inhale for a few seconds and then exhale slowly, letting your muscles relax. Practice yoga to help relax and work your muscles. Choose a stress reduction technique that suits your lifestyle and personality. These techniques take time and practice to develop. Set aside 5-15 minutes a day to do them. Therapists can offer training in these techniques. Other things you can do to manage stress include: Keeping a stress diary. Knowing your limits and saying no when you think something is too much. Paying attention to how you react to certain situations. You may not be able to control everything, but you can change your reaction. Adding humor to your life by watching funny films or TV shows. Making time for activities that you enjoy and that relax you.  Medicines Medicines, such as  antidepressants, are often a part of treatment for depression. Talk with your pharmacist or health care provider about all the medicines, supplements, and herbal products that you take, their possible side effects, and what medicines and other products are safe to take together. Make sure to report any side effects you may have to your health care provider. Relationships Your health care provider may suggest family therapy, couples therapy, or individual therapy as part of your treatment. How to recognize changes Everyone responds differently to treatment for depression. As you recover from depression, you may start to: Have more interest in doing activities. Feel less hopeless. Have more energy. Overeat less often, or have a better appetite. Have better mental focus. It is important to recognize if your depression is not getting better or is getting worse. The symptoms you had in the beginning may return, such as: Tiredness (fatigue) or low energy. Eating too much or too little. Sleeping too much or too little. Feeling restless, agitated, or hopeless. Trouble focusing or making decisions. Unexplained physical complaints. Feeling irritable, angry, or aggressive. If you or your family members notice these symptoms coming back, let your health care provider know right away. Follow these instructions at home: Activity  Try to get some form of exercise each day, such as walking, biking, swimming, or lifting weights. Practice stress reduction techniques. Engage your mind by taking a class or doing some volunteer work. Lifestyle Get the right amount and quality of sleep. Cut down on using caffeine, tobacco, alcohol, and other potentially harmful substances. Eat a healthy diet that includes plenty of  vegetables, fruits, whole grains, low-fat dairy products, and lean protein. Do not eat a lot of foods that are high in solid fats, added sugars, or salt (sodium). General instructions Take  over-the-counter and prescription medicines only as told by your health care provider. Keep all follow-up visits as told by your health care provider. This is important. Where to find support Talking to others Friends and family members can be sources of support and guidance. Talk to trusted friends or family members about your condition. Explain your symptoms to them, and let them know that you are working with a health care provider to treat your depression. Tell friends and family members how they also can be helpful. Finances Find appropriate mental health providers that fit with your financial situation. Talk with your health care provider about options to get reduced prices on your medicines. Where to find more information You can find support in your area from: Anxiety and Depression Association of America (ADAA): www.adaa.org Mental Health America: www.mentalhealthamerica.net [employer] on Mental Illness: www.nami.org Contact a health care provider if: You stop taking your antidepressant medicines, and you have any of these symptoms: Nausea. Headache. Light-headedness. Chills and body aches. Not being able to sleep (insomnia). You or your friends and family think your depression is getting worse. Get help right away if: You have thoughts of hurting yourself or others. If you ever feel like you may hurt yourself or others, or have thoughts about taking your own life, get help right away. Go to your nearest emergency department or: Call your local emergency services (911 in the [place] Call a suicide crisis helpline, such as the National Suicide Prevention Lifeline at (629)573-6318. This is open 24 hours a day in the [place] Text the Crisis Text Line at (575) 267-6758 (in the [place] Summary If you are diagnosed with depression, preparing yourself to manage your symptoms is a good way to feel positive about your future. Work with your health care provider on a management plan that  includes stress reduction techniques, medicines (if applicable), therapy, and healthy lifestyle habits. Keep talking with your health care provider about how your treatment is working. If you have thoughts about taking your own life, call a suicide crisis helpline or text a crisis text line. This information is not intended to replace advice given to you by your health care provider. Make sure you discuss any questions you have with your health care provider. Document Revised: 02/15/2019 Document Reviewed: 02/15/2019 Elsevier Patient Education  2022 [employer].

## 2021-01-21 ENCOUNTER — Other Ambulatory Visit: Payer: Self-pay | Admitting: Family Medicine

## 2021-01-21 ENCOUNTER — Telehealth: Payer: Self-pay | Admitting: Family Medicine

## 2021-01-21 DIAGNOSIS — R1013 Epigastric pain: Secondary | ICD-10-CM

## 2021-01-22 NOTE — Telephone Encounter (Signed)
TTC pt to let her know her window for the Depo Provera shot is 01/10/21 through 01/24/21 but no answer and no VM.

## 2021-01-24 NOTE — Telephone Encounter (Signed)
No answer, no voicemail.

## 2021-01-28 NOTE — Telephone Encounter (Signed)
Attempts to contact pt without return call in over 3 days, will close encounter. 

## 2021-02-19 ENCOUNTER — Ambulatory Visit (INDEPENDENT_AMBULATORY_CARE_PROVIDER_SITE_OTHER): Payer: 59 | Admitting: Family Medicine

## 2021-02-19 ENCOUNTER — Encounter: Payer: Self-pay | Admitting: Family Medicine

## 2021-02-19 ENCOUNTER — Other Ambulatory Visit: Payer: Self-pay

## 2021-02-19 ENCOUNTER — Other Ambulatory Visit: Payer: Self-pay | Admitting: Family Medicine

## 2021-02-19 ENCOUNTER — Ambulatory Visit: Payer: 59

## 2021-02-19 VITALS — BP 135/77 | HR 103 | Temp 98.2°F | Ht 63.0 in | Wt 148.5 lb

## 2021-02-19 DIAGNOSIS — Z3042 Encounter for surveillance of injectable contraceptive: Secondary | ICD-10-CM | POA: Diagnosis not present

## 2021-02-19 DIAGNOSIS — N644 Mastodynia: Secondary | ICD-10-CM | POA: Diagnosis not present

## 2021-02-19 DIAGNOSIS — F411 Generalized anxiety disorder: Secondary | ICD-10-CM | POA: Diagnosis not present

## 2021-02-19 DIAGNOSIS — R8290 Unspecified abnormal findings in urine: Secondary | ICD-10-CM | POA: Diagnosis not present

## 2021-02-19 DIAGNOSIS — F339 Major depressive disorder, recurrent, unspecified: Secondary | ICD-10-CM

## 2021-02-19 DIAGNOSIS — N3000 Acute cystitis without hematuria: Secondary | ICD-10-CM

## 2021-02-19 LAB — URINALYSIS, ROUTINE W REFLEX MICROSCOPIC
Bilirubin, UA: NEGATIVE
Glucose, UA: NEGATIVE
Ketones, UA: NEGATIVE
Nitrite, UA: NEGATIVE
Specific Gravity, UA: 1.025 (ref 1.005–1.030)
Urobilinogen, Ur: 1 mg/dL (ref 0.2–1.0)
pH, UA: 6 (ref 5.0–7.5)

## 2021-02-19 LAB — MICROSCOPIC EXAMINATION: Renal Epithel, UA: NONE SEEN /hpf

## 2021-02-19 LAB — PREGNANCY, URINE: Preg Test, Ur: NEGATIVE

## 2021-02-19 MED ORDER — BUPROPION HCL ER (XL) 150 MG PO TB24: 150.0000 mg | ORAL_TABLET | Freq: Every day | ORAL | 3 refills | Status: DC

## 2021-02-19 MED ORDER — CEPHALEXIN 500 MG PO CAPS: 500.0000 mg | ORAL_CAPSULE | Freq: Two times a day (BID) | ORAL | 0 refills | Status: DC

## 2021-02-19 MED ORDER — MEDROXYPROGESTERONE ACETATE 150 MG/ML IM SUSP
150.0000 mg | Freq: Once | INTRAMUSCULAR | Status: AC
  Administered 2021-02-19: 150 mg via INTRAMUSCULAR

## 2021-02-19 NOTE — Patient Instructions (Signed)
Breast Tenderness Breast tenderness is a common problem for women of all ages, but may also occur in men. Breast tenderness may range from mild discomfort to severe pain. In women, the pain usually comes and goes with the menstrual cycle, but it can also be constant. Breast tenderness has many possible causes, including hormone changes, infections, and taking certain medicines. You may have tests, such as a mammogram or an ultrasound, to check for any unusual findings. Having breast tenderness usually does not mean that you have breast cancer. Follow these instructions at home: Managing pain and discomfort  If directed, put ice to the painful area. To do this: Put ice in a plastic bag. Place a towel between your skin and the bag. Leave the ice on for 20 minutes, 2-3 times a day. Wear a supportive bra, especially during exercise. You may also want to wear a supportive bra while sleeping if your breasts are very tender. Medicines Take over-the-counter and prescription medicines only as told by your health care provider. If the cause of your pain is infection, you may be prescribed an antibiotic medicine. If you were prescribed an antibiotic, take it as told by your health care provider. Do not stop taking the antibiotic even if you start to feel better. Eating and drinking Your health care provider may recommend that you lessen the amount of fat in your diet. You can do this by: Limiting fried foods. Cooking foods using methods such as baking, boiling, grilling, and broiling. Decrease the amount of caffeine in your diet. Instead, drink more water and choose caffeine-free drinks. General instructions  Keep a log of the days and times when your breasts are most tender. Ask your health care provider how to do breast exams at home. This will help you notice if you have an unusual growth or lump. Keep all follow-up visits as told by your health care provider. This is important. Contact a health care  provider if: Any part of your breast is hard, red, and hot to the touch. This may be a sign of infection. You are a woman and: Not breastfeeding and you have fluid, especially blood or pus, coming out of your nipples. Have a new or painful lump in your breast that remains after your menstrual period ends. You have a fever. Your pain does not improve or it gets worse. Your pain is interfering with your daily activities. Summary Breast tenderness may range from mild discomfort to severe pain. Breast tenderness has many possible causes, including hormone changes, infections, and taking certain medicines. It can be treated with ice, wearing a supportive bra, and medicines. Make changes to your diet if told to by your health care provider. This information is not intended to replace advice given to you by your health care provider. Make sure you discuss any questions you have with your health care provider. Document Revised: 08/29/2018 Document Reviewed: 08/29/2018 Elsevier Patient Education  2022 [employer] Inc[employer]  

## 2021-02-19 NOTE — Progress Notes (Signed)
Established Patient Office Visit  Subjective:  Patient ID: Theresa Watts, female    DOB: [birth year]  Age: 30 y.o. MRN: [id number]  CC:  Chief Complaint  Patient presents with   Breast Pain   Anxiety   Depression    HPI Theresa Watts presents for follow up of anxiety and depression. She also reports left breast pain. This visit started as a telephone visit this morning, however given her symptoms of breast pain, she has come in this afternoon to complete the visit in person so that an exam can be completed.   Anxiety/depression She reports noticing some improvement with Pristiq but that her symptoms are still not well controlled. She denies side effects.   Depression screen [facility] 2/9 02/19/2021 01/06/2021 10/25/2020  Decreased Interest '1 3 3  ' Down, Depressed, Hopeless '1 2 2  ' PHQ - 2 Score '2 5 5  ' Altered sleeping '3 2 3  ' Tired, decreased energy '3 3 3  ' Change in appetite '1 2 3  ' Feeling bad or failure about yourself  '1 1 1  ' Trouble concentrating '1 2 3  ' Moving slowly or fidgety/restless 0 1 0  Suicidal thoughts 0 0 0  PHQ-9 Score '11 16 18  ' Difficult doing work/chores Somewhat difficult Somewhat difficult -  Some recent data might be hidden   GAD 7 : Generalized Anxiety Score 02/19/2021 01/06/2021 10/25/2020 08/05/2020  Nervous, Anxious, on Edge '1 3 3 1  ' Control/stop worrying '1 1 2 ' 0  Worry too much - different things '2 1 2 1  ' Trouble relaxing '2 3 2 ' 0  Restless 0 3 1 0  Easily annoyed or irritable '1 3 3 1  ' Afraid - awful might happen 0 0 0 0  Total GAD 7 Score '7 14 13 3  ' Anxiety Difficulty Somewhat difficult Somewhat difficult - -    2. Left breast pain Theresa Watts report left breast pain in the upper quadrant for 2 weeks. It feels like a tenderness and sometimes like a tingling to the nipple. She denies nipple retraction or discharge, changes to the skin, or erythema. She is overdue by a month for her depo injection. She has had a negative home pregnancy test. She denies a  family history of breast cancer.   Past Medical History:  Diagnosis Date   Medical history non-contributory     Past Surgical History:  Procedure Laterality Date   NO PAST SURGERIES      Family History  Problem Relation Age of Onset   Healthy Mother    Healthy Father     Social History   Socioeconomic History   Marital status: Single    Spouse name: Not on file   Number of children: Not on file   Years of education: Not on file   Highest education level: Not on file  Occupational History   Not on file  Tobacco Use   Smoking status: Never   Smokeless tobacco: Never  Vaping Use   Vaping Use: Never used  Substance and Sexual Activity   Alcohol use: No   Drug use: No   Sexual activity: Yes  Other Topics Concern   Not on file  Social History Narrative   Not on file   Social Determinants of Health   Financial Resource Strain: Not on file  Food Insecurity: Not on file  Transportation Needs: Not on file  Physical Activity: Not on file  Stress: Not on file  Social Connections: Not on file  Intimate Partner Violence: Not  on file    Outpatient Medications Prior to Visit  Medication Sig Dispense Refill   buPROPion (WELLBUTRIN XL) 150 MG 24 hr tablet Take 1 tablet (150 mg total) by mouth daily. 90 tablet 3   desvenlafaxine (PRISTIQ) 100 MG 24 hr tablet Take 1 tablet (100 mg total) by mouth daily. 90 tablet 3   medroxyPROGESTERone (DEPO-PROVERA) 150 MG/ML injection Inject 1 mL (150 mg total) into the muscle every 3 (three) months. 1 mL 2   pantoprazole (PROTONIX) 40 MG tablet Take 1 tablet (40 mg total) by mouth daily. 90 tablet 0   No facility-administered medications prior to visit.    Allergies  Allergen Reactions   Celebrex [Celecoxib] Hives    ROS Review of Systems As per HPI.   Objective:    Physical Exam Vitals and nursing note reviewed.  Constitutional:      General: She is not in acute distress.    Appearance: Normal appearance. She is not  ill-appearing, toxic-appearing or diaphoretic.  Pulmonary:     Effort: Pulmonary effort is normal. No respiratory distress.  Chest:  Breasts:    Breasts are symmetrical.     Right: No swelling, bleeding, inverted nipple, mass, nipple discharge, skin change or tenderness.     Left: Tenderness (Left upper quadrant) present. No swelling, bleeding, inverted nipple, mass, nipple discharge or skin change.  Musculoskeletal:     Right lower leg: No edema.     Left lower leg: No edema.  Lymphadenopathy:     Upper Body:     Right upper body: No supraclavicular, axillary or pectoral adenopathy.     Left upper body: No supraclavicular, axillary or pectoral adenopathy.  Skin:    General: Skin is warm and dry.  Neurological:     General: No focal deficit present.     Mental Status: She is alert and oriented to person, place, and time.  Psychiatric:        Mood and Affect: Mood normal.        Behavior: Behavior normal.    BP 135/77   Pulse (!) 103   Temp 98.2 F (36.8 C) (Temporal)   Ht '5\' 3"'  (1.6 m)   Wt 148 lb 8 oz (67.4 kg)   BMI 26.31 kg/m  Wt Readings from Last 3 Encounters:  02/19/21 148 lb 8 oz (67.4 kg)  01/06/21 145 lb 6 oz (65.9 kg)  10/25/20 149 lb 6.4 oz (67.8 kg)     Health Maintenance Due  Topic Date Due   Pneumococcal Vaccine 30-51 Years old (1 - PCV) Never done    There are no preventive care reminders to display for this patient.  Lab Results  Component Value Date   TSH 0.698 10/25/2020   Lab Results  Component Value Date   WBC 6.8 10/25/2020   HGB 14.7 10/25/2020   HCT 42.5 10/25/2020   MCV 94 10/25/2020   PLT 254 10/25/2020   Lab Results  Component Value Date   NA 139 10/25/2020   K 4.3 10/25/2020   CO2 21 10/25/2020   GLUCOSE 137 (H) 10/25/2020   BUN 10 10/25/2020   CREATININE 0.91 10/25/2020   BILITOT 0.9 10/25/2020   ALKPHOS 76 10/25/2020   AST 25 10/25/2020   ALT 35 (H) 10/25/2020   PROT 7.4 10/25/2020   ALBUMIN 4.7 10/25/2020   CALCIUM  9.3 10/25/2020   ANIONGAP 5 10/01/2015   EGFR 86 10/25/2020   Lab Results  Component Value Date   CHOL 206 (H)  04/04/2020   Lab Results  Component Value Date   HDL 39 (L) 04/04/2020   Lab Results  Component Value Date   LDLCALC 150 (H) 04/04/2020   Lab Results  Component Value Date   TRIG 94 04/04/2020   Lab Results  Component Value Date   CHOLHDL 5.3 (H) 04/04/2020   No results found for: HGBA1C    Assessment & Plan:   Theresa Watts was seen today for breast pain, anxiety and depression.  Diagnoses and all orders for this visit:  Breast pain, left No masses palpated on exam. Focal tenderness present. [place] ordered as below.  -     US BREAST COMPLETE UNI LEFT INC AXILLA; Future  Encounter for surveillance of injectable contraceptive Negative pregnancy test. Depo injection today in the office.  -     Pregnancy, urine -     medroxyPROGESTERone (DEPO-PROVERA) injection 150 mg  Abnormal urine odor Odor noted by lab tech today while running pregnancy test. UA and culture pending. Patient denies urinary symptoms. Will notify of results.  -     Urinalysis, Routine w reflex microscopic -     Urine Culture  GAD (generalized anxiety disorder) Improving but not well controlled. Continue pristiq. Add wellbutrin.   Depression, recurrent ([place]) Improving but not well controlled. Continue pristiq. Add wellbutrin. Denies SI.    Follow-up: Return in about 6 weeks (around 04/02/2021) for medication follow up.    Gwenlyn Perking, FNP

## 2021-02-19 NOTE — Progress Notes (Signed)
Patient is reports left breast pain today. She will come in this afternoon for an in person visit for evaluation so that a breast exam can also be completed.

## 2021-02-20 ENCOUNTER — Other Ambulatory Visit: Payer: Self-pay

## 2021-02-20 DIAGNOSIS — N644 Mastodynia: Secondary | ICD-10-CM

## 2021-02-24 LAB — URINE CULTURE

## 2021-03-11 ENCOUNTER — Ambulatory Visit: Payer: 59 | Admitting: Family Medicine

## 2021-04-07 ENCOUNTER — Other Ambulatory Visit: Payer: Self-pay | Admitting: Family Medicine

## 2021-04-07 DIAGNOSIS — K219 Gastro-esophageal reflux disease without esophagitis: Secondary | ICD-10-CM

## 2021-04-20 HISTORY — PX: WISDOM TOOTH EXTRACTION: SHX21

## 2021-04-25 ENCOUNTER — Encounter: Payer: Self-pay | Admitting: Nurse Practitioner

## 2021-04-25 ENCOUNTER — Ambulatory Visit: Payer: Self-pay | Admitting: Nurse Practitioner

## 2021-04-25 VITALS — BP 132/79 | HR 102 | Temp 98.0°F | Resp 20 | Ht 63.0 in | Wt 148.0 lb

## 2021-04-25 DIAGNOSIS — N3000 Acute cystitis without hematuria: Secondary | ICD-10-CM

## 2021-04-25 DIAGNOSIS — R300 Dysuria: Secondary | ICD-10-CM

## 2021-04-25 DIAGNOSIS — F411 Generalized anxiety disorder: Secondary | ICD-10-CM

## 2021-04-25 DIAGNOSIS — J00 Acute nasopharyngitis [common cold]: Secondary | ICD-10-CM

## 2021-04-25 LAB — URINALYSIS, COMPLETE
Bilirubin, UA: NEGATIVE
Glucose, UA: NEGATIVE
Ketones, UA: NEGATIVE
Nitrite, UA: NEGATIVE
Protein,UA: NEGATIVE
Specific Gravity, UA: 1.025 (ref 1.005–1.030)
Urobilinogen, Ur: 1 mg/dL (ref 0.2–1.0)
pH, UA: 5.5 (ref 5.0–7.5)

## 2021-04-25 LAB — MICROSCOPIC EXAMINATION
RBC, Urine: NONE SEEN /hpf (ref 0–2)
Renal Epithel, UA: NONE SEEN /hpf

## 2021-04-25 MED ORDER — CEPHALEXIN 500 MG PO CAPS: 500.0000 mg | ORAL_CAPSULE | Freq: Two times a day (BID) | ORAL | 0 refills | Status: DC

## 2021-04-25 MED ORDER — BUSPIRONE HCL 5 MG PO TABS: 5.0000 mg | ORAL_TABLET | Freq: Two times a day (BID) | ORAL | 1 refills | Status: DC

## 2021-04-25 NOTE — Progress Notes (Signed)
Subjective:    Patient ID: Theresa Watts, female    DOB: 08/13/1990, 31 y.o.   MRN: [id number]   Chief Complaint: Anxiety, Dysuria, and Nasal Congestion   HPI Patient comes in today with several complaints: - anxiety- patient is currently on wellbutrin and prisriq. She comes in today under a lot of stress. She had to place her daughter in [facility] in [place] for suicidal thoughts. She just got placed this morning.  GAD 7 : Generalized Anxiety Score 04/25/2021 02/19/2021 01/06/2021 10/25/2020  Nervous, Anxious, on Edge 3 1 3 3   Control/stop worrying 3 1 1 2   Worry too much - different things 3 2 1 2   Trouble relaxing 3 2 3 2   Restless 2 0 3 1  Easily annoyed or irritable 3 1 3 3   Afraid - awful might happen 2 0 0 0  Total GAD 7 Score 19 7 14 13   Anxiety Difficulty Not difficult at all Somewhat difficult Somewhat difficult -     -dysuria- started a week ago. Has gotten some better drinling lots of water. Has some frequency and urgency - she has had congestion for several weeks . Is worse at night. No fever .    Review of Systems  Constitutional:  Negative for chills and fever.  HENT:  Positive for congestion and rhinorrhea. Negative for sinus pressure, sinus pain, sneezing and sore throat.   Respiratory:  Positive for cough. Negative for shortness of breath.   Genitourinary:  Positive for dysuria, frequency and urgency. Negative for hematuria.  Psychiatric/Behavioral:  The patient is nervous/anxious.       Objective:   Physical Exam Vitals reviewed.  Constitutional:      Appearance: Normal appearance.  HENT:     Right Ear: Tympanic membrane normal.     Left Ear: Tympanic membrane normal.     Nose: Nose normal. No congestion or rhinorrhea.     Mouth/Throat:     Mouth: Mucous membranes are moist.  Eyes:     Extraocular Movements: Extraocular movements intact.     Pupils: Pupils are equal, round, and reactive to light.  Cardiovascular:     Rate and  Rhythm: Normal rate and regular rhythm.     Heart sounds: Normal heart sounds.  Pulmonary:     Breath sounds: Normal breath sounds.  Skin:    General: Skin is warm.  Neurological:     General: No focal deficit present.     Mental Status: She is alert and oriented to person, place, and time.  Psychiatric:        Mood and Affect: Mood normal.        Behavior: Behavior normal.    BP 132/79    Pulse (!) 102    Temp 98 F (36.7 C) (Temporal)    Resp 20    Ht 5\' 3"  (1.6 m)    Wt 148 lb (67.1 kg)    SpO2 100%    BMI 26.22 kg/m        Assessment & Plan:  Theresa Watts in today with chief complaint of Anxiety, Dysuria, and Nasal Congestion   1. Dysuria - Urinalysis, Complete  2. Acute cystitis without hematuria Take medication as prescribe Cotton underwear Take shower not bath Cranberry juice, yogurt Force fluids AZO over the counter X2 days Culture pending RTO prn  - cephALEXin (KEFLEX) 500 MG capsule; Take 1 capsule (500 mg total) by mouth 2 (two) times daily.  Dispense: 14 capsule; Refill: 0  3. GAD (generalized anxiety disorder) Stress management - busPIRone (BUSPAR) 5 MG tablet; Take 1 tablet (5 mg total) by mouth 2 (two) times daily.  Dispense: 60 tablet; Refill: 1  4. Acute nasopharyngitis 1. Take meds as prescribed 2. Use a cool mist humidifier especially during the winter months and when heat has been humid. 3. Use saline nose sprays frequently 4. Saline irrigations of the nose can be very helpful if done frequently.  * 4X daily for 1 week*  * Use of a nettie pot can be helpful with this. Follow directions with this* 5. Drink plenty of fluids 6. Keep thermostat turn down low 7.For any cough or congestion- mucinex 8. For fever or aces or pains- take tylenol or ibuprofen appropriate for age and weight.  * for fevers greater than 101 orally you may alternate ibuprofen and tylenol every  3 hours.       The above assessment and management plan was  discussed with the patient. The patient verbalized understanding of and has agreed to the management plan. Patient is aware to call the clinic if symptoms persist or worsen. Patient is aware when to return to the clinic for a follow-up visit. Patient educated on when it is appropriate to go to the emergency department.   Mary-Margaret Hassell Done, FNP

## 2021-04-25 NOTE — Patient Instructions (Signed)

## 2021-04-29 LAB — URINE CULTURE

## 2021-05-10 ENCOUNTER — Other Ambulatory Visit: Payer: Self-pay | Admitting: Family Medicine

## 2021-05-10 DIAGNOSIS — Z3042 Encounter for surveillance of injectable contraceptive: Secondary | ICD-10-CM

## 2021-05-13 ENCOUNTER — Telehealth: Payer: Self-pay | Admitting: Family Medicine

## 2021-05-13 DIAGNOSIS — F411 Generalized anxiety disorder: Secondary | ICD-10-CM

## 2021-05-13 DIAGNOSIS — F339 Major depressive disorder, recurrent, unspecified: Secondary | ICD-10-CM

## 2021-05-13 NOTE — Telephone Encounter (Signed)
REFERRAL REQUEST Telephone Note  Have you been seen at our office for this problem? Yes pt states this as discussed with Tiffany but she was not ready yet for referral (Advise that they may need an appointment with their PCP before a referral can be done)  Reason for Referral: Further medication evaluation Referral discussed with patient: yes  Best contact number of patient for referral team: [phone]    Has patient been seen by a specialist for this issue before: no  Patient provider preference for referral: [facility] Patient location preference for referral: [place]   Patient notified that referrals can take up to a week or longer to process. If they haven't heard anything within a week they should call back and speak with the referral department.

## 2021-05-17 ENCOUNTER — Other Ambulatory Visit: Payer: Self-pay | Admitting: Nurse Practitioner

## 2021-05-17 DIAGNOSIS — F411 Generalized anxiety disorder: Secondary | ICD-10-CM

## 2021-05-18 NOTE — Telephone Encounter (Signed)
Where is she asking to be referred to? Are we referring to her anxiety & depression?

## 2021-05-21 NOTE — Telephone Encounter (Signed)
Attempted to contact patient - NVM 

## 2021-05-22 NOTE — Telephone Encounter (Signed)
Patient aware and verbalized understanding. °

## 2021-05-22 NOTE — Addendum Note (Signed)
Addended by: Gwenlyn Fudge on: 05/22/2021 11:52 AM   Modules accepted: Orders

## 2021-05-22 NOTE — Telephone Encounter (Signed)
Called and spoke with patient it is for [facility] [place] and it is for her anxiety and depression.

## 2021-05-22 NOTE — Telephone Encounter (Signed)
Referral placed.

## 2021-06-13 ENCOUNTER — Ambulatory Visit (INDEPENDENT_AMBULATORY_CARE_PROVIDER_SITE_OTHER): Payer: Self-pay | Admitting: *Deleted

## 2021-06-13 DIAGNOSIS — Z3042 Encounter for surveillance of injectable contraceptive: Secondary | ICD-10-CM

## 2021-06-13 LAB — PREGNANCY, URINE: Preg Test, Ur: NEGATIVE

## 2021-06-13 MED ORDER — MEDROXYPROGESTERONE ACETATE 150 MG/ML IM SUSP
150.0000 mg | INTRAMUSCULAR | Status: AC
  Administered 2021-06-13 – 2021-09-22 (×2): 150 mg via INTRAMUSCULAR

## 2021-06-13 NOTE — Progress Notes (Signed)
Pt was outside of her window, did pregnancy test which was NEG. Pt than was given her depo-provera inj 150mg  IM on r-deltoid. Pt tol tx well

## 2021-08-29 ENCOUNTER — Ambulatory Visit: Payer: Self-pay

## 2021-09-22 ENCOUNTER — Ambulatory Visit (INDEPENDENT_AMBULATORY_CARE_PROVIDER_SITE_OTHER): Payer: Self-pay | Admitting: Emergency Medicine

## 2021-09-22 DIAGNOSIS — Z3042 Encounter for surveillance of injectable contraceptive: Secondary | ICD-10-CM

## 2021-09-22 LAB — PREGNANCY, URINE: Preg Test, Ur: NEGATIVE

## 2021-11-27 ENCOUNTER — Encounter: Payer: Self-pay | Admitting: Family Medicine

## 2021-11-27 ENCOUNTER — Ambulatory Visit (INDEPENDENT_AMBULATORY_CARE_PROVIDER_SITE_OTHER): Payer: 59 | Admitting: Family Medicine

## 2021-11-27 VITALS — BP 124/80 | HR 99 | Temp 98.3°F | Ht 63.0 in | Wt 147.5 lb

## 2021-11-27 DIAGNOSIS — E663 Overweight: Secondary | ICD-10-CM

## 2021-11-27 DIAGNOSIS — E785 Hyperlipidemia, unspecified: Secondary | ICD-10-CM

## 2021-11-27 DIAGNOSIS — R69 Illness, unspecified: Secondary | ICD-10-CM | POA: Diagnosis not present

## 2021-11-27 DIAGNOSIS — R140 Abdominal distension (gaseous): Secondary | ICD-10-CM

## 2021-11-27 DIAGNOSIS — F339 Major depressive disorder, recurrent, unspecified: Secondary | ICD-10-CM

## 2021-11-27 DIAGNOSIS — R109 Unspecified abdominal pain: Secondary | ICD-10-CM | POA: Diagnosis not present

## 2021-11-27 DIAGNOSIS — G8929 Other chronic pain: Secondary | ICD-10-CM

## 2021-11-27 DIAGNOSIS — F411 Generalized anxiety disorder: Secondary | ICD-10-CM

## 2021-11-27 MED ORDER — DICYCLOMINE HCL 10 MG PO CAPS: 10.0000 mg | ORAL_CAPSULE | Freq: Three times a day (TID) | ORAL | 1 refills | Status: DC

## 2021-11-27 NOTE — Progress Notes (Signed)
Established Patient Office Visit  Subjective   Patient ID: Theresa Watts, female    DOB: 1990-08-06  Age: 31 y.o. MRN: [id number]  Chief Complaint  Patient presents with   Obesity    HPI [place] would like to discuss weight loss options. She has been tried numerous diets, currently on a keto diet and has been exercising regularly. Her weight as been stable but she has not had any weight loss like she desires.   She also reports daily abdominal cramping and bloating that occurs after eatings. The cramping is generalized. She has not noticed the the symptoms occur with certain types of foods.   She has discontinued most of her medications and feels like her symptoms are fairly well controlled.      11/27/2021    2:34 PM 04/25/2021    9:16 AM 02/19/2021   10:22 AM  Depression screen PHQ 2/9  Decreased Interest '1 2 1  ' Down, Depressed, Hopeless '1 3 1  ' PHQ - 2 Score '2 5 2  ' Altered sleeping 0 3 3  Tired, decreased energy '2 3 3  ' Change in appetite '3 3 1  ' Feeling bad or failure about yourself  0 2 1  Trouble concentrating 0 3 1  Moving slowly or fidgety/restless 0 1 0  Suicidal thoughts 0 0 0  PHQ-9 Score '7 20 11  ' Difficult doing work/chores Not difficult at all Somewhat difficult Somewhat difficult      11/27/2021    2:34 PM 04/25/2021    9:16 AM 02/19/2021   10:24 AM 01/06/2021   11:41 AM  GAD 7 : Generalized Anxiety Score  Nervous, Anxious, on Edge '3 3 1 3  ' Control/stop worrying '1 3 1 1  ' Worry too much - different things '1 3 2 1  ' Trouble relaxing '1 3 2 3  ' Restless 0 2 0 3  Easily annoyed or irritable '3 3 1 3  ' Afraid - awful might happen 0 2 0 0  Total GAD 7 Score '9 19 7 14  ' Anxiety Difficulty Somewhat difficult Not difficult at all Somewhat difficult Somewhat difficult      Review of Systems  Constitutional:  Negative for chills, diaphoresis, fever, malaise/fatigue and weight loss.  Respiratory:  Negative for shortness of breath and wheezing.   Cardiovascular:   Negative for chest pain and leg swelling.  Gastrointestinal:  Positive for abdominal pain and constipation. Negative for diarrhea, heartburn, nausea and vomiting.  Neurological:  Negative for tingling, tremors, focal weakness, seizures and weakness.  Psychiatric/Behavioral:  Negative for suicidal ideas.       Objective:     BP 124/80   Pulse 99   Temp 98.3 F (36.8 C) (Temporal)   Ht '5\' 3"'  (1.6 m)   Wt 147 lb 8 oz (66.9 kg)   SpO2 98%   BMI 26.13 kg/m  Wt Readings from Last 3 Encounters:  11/27/21 147 lb 8 oz (66.9 kg)  04/25/21 148 lb (67.1 kg)  02/19/21 148 lb 8 oz (67.4 kg)      Physical Exam Vitals and nursing note reviewed.  Constitutional:      General: She is not in acute distress.    Appearance: She is not ill-appearing, toxic-appearing or diaphoretic.  Cardiovascular:     Rate and Rhythm: Normal rate and regular rhythm.     Heart sounds: Normal heart sounds. No murmur heard. Pulmonary:     Effort: Pulmonary effort is normal. No respiratory distress.     Breath sounds: Normal  breath sounds.  Abdominal:     General: Bowel sounds are normal. There is no distension.     Palpations: Abdomen is soft. There is no shifting dullness, fluid wave or mass.     Tenderness: There is generalized abdominal tenderness (mild). There is no guarding or rebound.     Hernia: No hernia is present.  Musculoskeletal:     Right lower leg: No edema.     Left lower leg: No edema.  Skin:    General: Skin is warm and dry.  Neurological:     General: No focal deficit present.     Mental Status: She is alert and oriented to person, place, and time.  Psychiatric:        Behavior: Behavior normal.      No results found for any visits on 11/27/21.    The ASCVD Risk score (Arnett DK, et al., 2019) failed to calculate for the following reasons:   The 2019 ASCVD risk score is only valid for ages 69 to 87    Assessment & Plan:   [place] was seen today for obesity.  Diagnoses  and all orders for this visit:  Chronic abdominal pain Abdominal bloating Abdominal cramping Symptoms consistent with IBS. Discussed food diary and diet. Try Bentyl as below. Will follow up in 6 weeks, sooner for new or worsening symptoms. Referral to GI placed for further evaluation.  -     dicyclomine (BENTYL) 10 MG capsule; Take 1 capsule (10 mg total) by mouth 4 (four) times daily -  before meals and at bedtime. -     Ambulatory referral to Gastroenterology  Overweight (BMI 25.0-29.9) Will check labs as below. BMI is slightly elevated at 26. Discussed diet and exercise. Declined referral to nutrition.  -     CMP14+EGFR -     BMP8+EGFR -     Lipid panel -     TSH  Hyperlipidemia, unspecified hyperlipidemia type Diet and exercise. Labs pending.  -     CMP14+EGFR -     BMP8+EGFR -     Lipid panel  Depression, recurrent (HCC) GAD (generalized anxiety disorder) Declines treatment. Has self discontinued medications and scoring has improved.    Return in about 6 weeks (around 01/08/2022) for abdominal pain.    Gwenlyn Perking, FNP

## 2021-11-27 NOTE — Patient Instructions (Signed)
Diet for Irritable Bowel Syndrome When you have irritable bowel syndrome (IBS), it is very important to follow the eating habits that are best for your condition. IBS may cause various symptoms, such as pain in the abdomen, constipation, or diarrhea. Choosing the right foods can help to ease the discomfort from these symptoms. Work with your health care provider and dietitian to find the eating plan that will help to control your symptoms. What are tips for following this plan?  Keep a food diary. This will help you identify foods that cause symptoms. Write down: What you eat and when you eat it. What symptoms you have. When symptoms occur in relation to your meals, such as "pain in abdomen 2 hours after dinner." Eat your meals slowly and in a relaxed setting. Aim to eat 5-6 small meals per day. Do not skip meals. Drink enough fluid to keep your urine pale yellow. Ask your health care provider if you should take an over-the-counter probiotic to help restore healthy bacteria in your gut (digestive tract). Probiotics are foods that contain good bacteria and yeasts. Your dietitian may have specific dietary recommendations for you based on your symptoms. Your dietitian may recommend that you: Avoid foods that cause symptoms. Talk with your dietitian about other ways to get the same nutrients that are in those problem foods. Avoid foods with gluten. Gluten is a protein that is found in rye, wheat, and barley. Eat more foods that contain soluble fiber. Examples of foods with high soluble fiber include oats, seeds, and certain fruits and vegetables. Take a fiber supplement if told by your dietitian. Reduce or avoid certain foods called FODMAPs. These are foods that contain sugars that are hard for some people to digest. Ask your health care provider which foods to avoid. What foods should I avoid? The following are some foods and drinks that may make your symptoms worse: Fatty foods, such as french  fries. Foods that contain gluten, such as pasta and cereal. Dairy products, such as milk, cheese, and ice cream. Spicy foods. Alcohol. Products with caffeine, such as coffee, tea, or chocolate. Carbonated drinks, such as soda. Foods that are high in FODMAPs. These include certain fruits and vegetables. Products with sweeteners such as honey, high fructose corn syrup, sorbitol, and mannitol. The items listed above may not be a complete list of foods and beverages you should avoid. Contact a dietitian for more information. What foods are good sources of fiber? Your health care provider or dietitian may recommend that you eat more foods that contain fiber. Fiber can help to reduce constipation and other IBS symptoms. Add foods with fiber to your diet a little at a time so your body can get used to them. Too much fiber at one time might cause gas and swelling of your abdomen. The following are some foods that are good sources of fiber: Berries, such as raspberries, strawberries, and blueberries. Tomatoes. Carrots. Brown rice. Oats. Seeds, such as chia and pumpkin seeds. The items listed above may not be a complete list of recommended sources of fiber. Contact your dietitian for more options. Where to find more information International Foundation for Functional Gastrointestinal Disorders: aboutibs.org National Institute of Diabetes and Digestive and Kidney Diseases: niddk.nih.gov Summary When you have irritable bowel syndrome (IBS), it is very important to follow the eating habits that are best for your condition. IBS may cause various symptoms, such as pain in the abdomen, constipation, or diarrhea. Choosing the right foods can help to ease the   discomfort that comes from symptoms. Your health care provider or dietitian may recommend that you eat more foods that contain fiber. Keep a food diary. This will help you identify foods that cause symptoms. This information is not intended to replace  advice given to you by your health care provider. Make sure you discuss any questions you have with your health care provider. Document Revised: 03/18/2021 Document Reviewed: 03/18/2021 Elsevier Patient Education  2023 Elsevier Inc.  

## 2021-11-28 LAB — CMP14+EGFR
ALT: 17 IU/L (ref 0–32)
AST: 12 IU/L (ref 0–40)
Albumin/Globulin Ratio: 1.9 (ref 1.2–2.2)
Albumin: 4.7 g/dL (ref 3.9–4.9)
Alkaline Phosphatase: 81 IU/L (ref 44–121)
BUN/Creatinine Ratio: 10 (ref 9–23)
BUN: 8 mg/dL (ref 6–20)
Bilirubin Total: 0.7 mg/dL (ref 0.0–1.2)
CO2: 21 mmol/L (ref 20–29)
Calcium: 9.6 mg/dL (ref 8.7–10.2)
Chloride: 103 mmol/L (ref 96–106)
Creatinine, Ser: 0.78 mg/dL (ref 0.57–1.00)
Globulin, Total: 2.5 g/dL (ref 1.5–4.5)
Glucose: 83 mg/dL (ref 70–99)
Potassium: 4.1 mmol/L (ref 3.5–5.2)
Sodium: 138 mmol/L (ref 134–144)
Total Protein: 7.2 g/dL (ref 6.0–8.5)
eGFR: 103 mL/min/{1.73_m2} (ref >59)

## 2021-11-28 LAB — LIPID PANEL
Chol/HDL Ratio: 5.1 ratio — ABNORMAL HIGH (ref 0.0–4.4)
Cholesterol, Total: 200 mg/dL — ABNORMAL HIGH (ref 100–199)
HDL: 39 mg/dL — ABNORMAL LOW (ref >39)
LDL Chol Calc (NIH): 142 mg/dL — ABNORMAL HIGH (ref 0–99)
Triglycerides: 107 mg/dL (ref 0–149)
VLDL Cholesterol Cal: 19 mg/dL (ref 5–40)

## 2021-11-28 LAB — TSH: TSH: 0.663 u[IU]/mL (ref 0.450–4.500)

## 2021-12-03 ENCOUNTER — Encounter: Payer: Self-pay | Admitting: Family Medicine

## 2021-12-10 ENCOUNTER — Encounter: Payer: Self-pay | Admitting: *Deleted

## 2022-01-01 ENCOUNTER — Ambulatory Visit: Payer: Self-pay | Admitting: Internal Medicine

## 2022-01-08 ENCOUNTER — Ambulatory Visit: Payer: 59 | Admitting: Family Medicine

## 2022-01-08 ENCOUNTER — Telehealth: Payer: Self-pay | Admitting: Family Medicine

## 2022-01-08 NOTE — Telephone Encounter (Signed)
Disregard message. I called pt back.

## 2022-01-15 ENCOUNTER — Other Ambulatory Visit: Payer: Self-pay | Admitting: Family Medicine

## 2022-01-15 ENCOUNTER — Ambulatory Visit (INDEPENDENT_AMBULATORY_CARE_PROVIDER_SITE_OTHER): Payer: 59 | Admitting: *Deleted

## 2022-01-15 DIAGNOSIS — Z3042 Encounter for surveillance of injectable contraceptive: Secondary | ICD-10-CM | POA: Diagnosis not present

## 2022-01-15 LAB — PREGNANCY, URINE: Preg Test, Ur: NEGATIVE

## 2022-01-15 MED ORDER — MEDROXYPROGESTERONE ACETATE 150 MG/ML IM SUSP: 150.0000 mg | INTRAMUSCULAR | 1 refills | Status: DC

## 2022-01-15 MED ORDER — MEDROXYPROGESTERONE ACETATE 150 MG/ML IM SUSP
150.0000 mg | Freq: Once | INTRAMUSCULAR | Status: AC
  Administered 2022-01-15: 150 mg via INTRAMUSCULAR

## 2022-01-16 ENCOUNTER — Ambulatory Visit: Payer: 59

## 2022-02-24 ENCOUNTER — Telehealth (INDEPENDENT_AMBULATORY_CARE_PROVIDER_SITE_OTHER): Payer: 59 | Admitting: Nurse Practitioner

## 2022-02-24 ENCOUNTER — Encounter: Payer: Self-pay | Admitting: Nurse Practitioner

## 2022-02-24 DIAGNOSIS — U071 COVID-19: Secondary | ICD-10-CM

## 2022-02-24 MED ORDER — GUAIFENESIN ER 600 MG PO TB12: 600.0000 mg | ORAL_TABLET | Freq: Two times a day (BID) | ORAL | 0 refills | Status: DC

## 2022-02-24 MED ORDER — MOLNUPIRAVIR EUA 200MG CAPSULE: 4.0000 | ORAL_CAPSULE | Freq: Two times a day (BID) | ORAL | 0 refills | Status: AC

## 2022-02-24 MED ORDER — BENZONATATE 100 MG PO CAPS: 100.0000 mg | ORAL_CAPSULE | Freq: Three times a day (TID) | ORAL | 0 refills | Status: DC | PRN

## 2022-02-24 MED ORDER — ACETAMINOPHEN 500 MG PO TABS: 500.0000 mg | ORAL_TABLET | Freq: Four times a day (QID) | ORAL | 0 refills | Status: DC | PRN

## 2022-02-24 NOTE — Progress Notes (Signed)
Virtual Visit via Video Note   This visit type was conducted due to national recommendations for restrictions regarding the COVID-19 Pandemic (e.g. social distancing) in an effort to limit this patient's exposure and mitigate transmission in our community.  Due to her co-morbid illnesses, this patient is at least at moderate risk for complications without adequate follow up.  This format is felt to be most appropriate for this patient at this time.  All issues noted in this document were discussed and addressed.  A limited physical exam was performed with this format.  A verbal consent was obtained for the virtual visit.   Date:  02/24/2022   ID:  Theresa Watts, DOB 12/07/1990, MRN [id number]  Patient Location: Home Provider Location: Other:  Remote  PCP:  [name], FNP   Evaluation Performed:  Follow-Up Visit  Chief Complaint: Positive COVID-19  History of Present Illness:    Theresa Watts is a 31 y.o. female with Upper Respiratory Infection: Patient complains of symptoms of a URI. Symptoms include congestion, cough, and fever. Onset of symptoms was a few days ago, unchanged since that time. She also c/o achiness for the past 1 day .  She is drinking plenty of fluids. Evaluation to date: none. Treatment to date: none.    The patient does have symptoms concerning for COVID-19 infection (fever, chills, cough, or new shortness of breath).    Past Medical History:  Diagnosis Date   Medical history non-contributory     Past Surgical History:  Procedure Laterality Date   NO PAST SURGERIES      Family History  Problem Relation Age of Onset   Healthy Mother    Healthy Father     Social History   Socioeconomic History   Marital status: Single    Spouse name: Not on file   Number of children: Not on file   Years of education: Not on file   Highest education level: Not on file  Occupational History   Not on file  Tobacco Use   Smoking status: Never    Smokeless tobacco: Never  Vaping Use   Vaping Use: Never used  Substance and Sexual Activity   Alcohol use: No   Drug use: No   Sexual activity: Yes  Other Topics Concern   Not on file  Social History Narrative   Not on file   Social Determinants of Health   Financial Resource Strain: Not on file  Food Insecurity: Not on file  Transportation Needs: Not on file  Physical Activity: Not on file  Stress: Not on file  Social Connections: Not on file  Intimate Partner Violence: Not on file    Outpatient Medications Prior to Visit  Medication Sig Dispense Refill   dicyclomine (BENTYL) 10 MG capsule Take 1 capsule (10 mg total) by mouth 4 (four) times daily -  before meals and at bedtime. 360 capsule 1   medroxyPROGESTERone (DEPO-PROVERA) 150 MG/ML injection Inject 1 mL (150 mg total) into the muscle every 3 (three) months. 1 mL 1   Facility-Administered Medications Prior to Visit  Medication Dose Route Frequency Provider Last Rate Last Admin   medroxyPROGESTERone (DEPO-PROVERA) injection 150 mg  150 mg Intramuscular Q90 days Gwenlyn Fudge, FNP   150 mg at 09/22/21 1504    Allergies:   Celebrex [celecoxib]   Social History   Tobacco Use   Smoking status: Never   Smokeless tobacco: Never  Vaping Use   Vaping Use: Never used  Substance Use Topics   Alcohol use: No   Drug use: No     Review of Systems  Constitutional:  Positive for malaise/fatigue. Negative for chills and fever.  HENT:  Positive for congestion.   Respiratory:  Positive for cough.   Genitourinary: Negative.   Skin: Negative.  Negative for itching and rash.  Neurological:  Positive for headaches.  All other systems reviewed and are negative.    Labs/Other Tests and Data Reviewed:    Recent Labs: 11/27/2021: ALT 17; BUN 8; Creatinine, Ser 0.78; Potassium 4.1; Sodium 138; TSH 0.663   Recent Lipid Panel Lab Results  Component Value Date/Time   CHOL 200 (H) 11/27/2021 03:10 PM   TRIG 107  11/27/2021 03:10 PM   HDL 39 (L) 11/27/2021 03:10 PM   CHOLHDL 5.1 (H) 11/27/2021 03:10 PM   LDLCALC 142 (H) 11/27/2021 03:10 PM    Wt Readings from Last 3 Encounters:  11/27/21 147 lb 8 oz (66.9 kg)  04/25/21 148 lb (67.1 kg)  02/19/21 148 lb 8 oz (67.4 kg)     Objective:    Vital Signs:  There were no vitals taken for this visit.   Physical Exam Constitutional:      Appearance: Normal appearance.  HENT:     Head: Normocephalic.     Nose: Congestion present.  Eyes:     Conjunctiva/sclera: Conjunctivae normal.  Neurological:     Mental Status: She is alert.   Limited physical exam due to virtual visit.  ASSESSMENT & PLAN:   1. Positive self-administered antigen test for COVID-19 - guaiFENesin (MUCINEX) 600 MG 12 hr tablet; Take 1 tablet (600 mg total) by mouth 2 (two) times daily.  Dispense: 30 tablet; Refill: 0 - acetaminophen (TYLENOL) 500 MG tablet; Take 1 tablet (500 mg total) by mouth every 6 (six) hours as needed.  Dispense: 30 tablet; Refill: 0 - benzonatate (TESSALON PERLES) 100 MG capsule; Take 1 capsule (100 mg total) by mouth 3 (three) times daily as needed.  Dispense: 20 capsule; Refill: 0 - molnupiravir EUA (LAGEVRIO) 200 mg CAPS capsule; Take 4 capsules (800 mg total) by mouth 2 (two) times daily for 5 days.  Dispense: 40 capsule; Refill: 0   Take meds as prescribed - Use a cool mist humidifier  -Use saline nose sprays frequently -Force fluids -Molnupiravir antiviral sent to pharmacy. -Completed education patient verbalized understanding. -For fever or aches or pains- take Tylenol or ibuprofen.  Follow up with worsening unresolved symptoms    COVID-19 Education: The signs and symptoms of COVID-19 were discussed with the patient and how to seek care for testing (follow up with PCP or arrange E-visit). The importance of social distancing was discussed today.  Time:   Today, I have spent 12 minutes with the patient with telehealth technology discussing  the above problems.    Follow Up:  Virtual Visit  prn  Signed, Ivy Lynn, NP  02/24/2022 1:34 PM    [place]

## 2022-04-21 ENCOUNTER — Telehealth: Payer: Self-pay | Admitting: Family Medicine

## 2022-05-07 DIAGNOSIS — R69 Illness, unspecified: Secondary | ICD-10-CM | POA: Diagnosis not present

## 2022-05-07 DIAGNOSIS — Z79899 Other long term (current) drug therapy: Secondary | ICD-10-CM | POA: Diagnosis not present

## 2022-05-07 DIAGNOSIS — F411 Generalized anxiety disorder: Secondary | ICD-10-CM | POA: Diagnosis not present

## 2022-05-07 DIAGNOSIS — Z5181 Encounter for therapeutic drug level monitoring: Secondary | ICD-10-CM | POA: Diagnosis not present

## 2022-05-07 DIAGNOSIS — F1320 Sedative, hypnotic or anxiolytic dependence, uncomplicated: Secondary | ICD-10-CM | POA: Diagnosis not present

## 2022-06-04 DIAGNOSIS — F411 Generalized anxiety disorder: Secondary | ICD-10-CM | POA: Diagnosis not present

## 2022-06-04 DIAGNOSIS — R69 Illness, unspecified: Secondary | ICD-10-CM | POA: Diagnosis not present

## 2022-07-06 DIAGNOSIS — F900 Attention-deficit hyperactivity disorder, predominantly inattentive type: Secondary | ICD-10-CM | POA: Diagnosis not present

## 2022-07-06 DIAGNOSIS — F411 Generalized anxiety disorder: Secondary | ICD-10-CM | POA: Diagnosis not present

## 2022-09-08 ENCOUNTER — Ambulatory Visit (INDEPENDENT_AMBULATORY_CARE_PROVIDER_SITE_OTHER): Payer: 59 | Admitting: Family Medicine

## 2022-09-08 ENCOUNTER — Encounter: Payer: Self-pay | Admitting: Family Medicine

## 2022-09-08 VITALS — BP 110/62 | HR 90 | Temp 98.9°F | Ht 63.0 in | Wt 148.0 lb

## 2022-09-08 DIAGNOSIS — Z0000 Encounter for general adult medical examination without abnormal findings: Secondary | ICD-10-CM | POA: Diagnosis not present

## 2022-09-08 DIAGNOSIS — F339 Major depressive disorder, recurrent, unspecified: Secondary | ICD-10-CM

## 2022-09-08 DIAGNOSIS — Z0001 Encounter for general adult medical examination with abnormal findings: Secondary | ICD-10-CM | POA: Diagnosis not present

## 2022-09-08 DIAGNOSIS — L700 Acne vulgaris: Secondary | ICD-10-CM

## 2022-09-08 DIAGNOSIS — Z136 Encounter for screening for cardiovascular disorders: Secondary | ICD-10-CM

## 2022-09-08 MED ORDER — ADAPALENE 0.1 % EX CREA
TOPICAL_CREAM | Freq: Every day | CUTANEOUS | 0 refills | Status: AC
Start: 2022-09-08 — End: ?

## 2022-09-08 NOTE — Progress Notes (Signed)
Theresa Watts is a 32 y.o. female presents to office today for annual physical exam examination.    Concerns today include: 1.  States that she was previously seen by dermatology and was taking doxycycline for them. Then started having yeast infection, so stopped doxycycline. States that she has been washing with dial soap. Reports that she has used OTC acne creams.   Occupation: starting nursing school, [employer] as wound nurse.  Marital status: female  Diet: 2 meals per day, is trying Keto  Exercise: 1 hour walking on treadmill  Substance use: none  Last eye exam: due, no changes to vision  Last dental exam: 3 months ago  Last colonoscopy: not due  Last mammogram: not due  Last pap smear: due goes to gynecology  Contraceptive: natural family planning - had side effects of Depo with weight gain  Refills needed today: none  Other specialists seen: gynecology, [facility]  Fasting today:  no  Immunizations needed: Flu Vaccine: no  Tdap Vaccine: no  - every 32yrs - (<3 lifetime doses or unknown): all wounds -- look up need for Tetanus IG - (>=3 lifetime doses): clean/minor wound if >23yrs from previous; all other wounds if >6yrs from previous Zoster Vaccine: no (those >50yo, once) Pneumonia Vaccine: no (those w/ risk factors) - (<81yr) Both: Immunocompromised, cochlear implant, CSF leak, asplenic, sickle cell, Chronic Renal Failure - (<9yr) PPSV-23 only: Heart dz, lung disease, DM, tobacco abuse, alcoholism, cirrhosis/liver disease. - (>56yr): PPSV13 then PPSV23 in 6-12mths;  - (>42yr): repeat PPSV23 once if pt received prior to 34yo and 35yrs have passed   Past Medical History:  Diagnosis Date  . Medical history non-contributory    Social History   Socioeconomic History  . Marital status: Single    Spouse name: Not on file  . Number of children: Not on file  . Years of education: Not on file  . Highest education level: Not on file  Occupational  History  . Not on file  Tobacco Use  . Smoking status: Never  . Smokeless tobacco: Never  Vaping Use  . Vaping Use: Never used  Substance and Sexual Activity  . Alcohol use: No  . Drug use: No  . Sexual activity: Yes  Other Topics Concern  . Not on file  Social History Narrative  . Not on file   Social Determinants of Health   Financial Resource Strain: Not on file  Food Insecurity: Not on file  Transportation Needs: Not on file  Physical Activity: Not on file  Stress: Not on file  Social Connections: Not on file  Intimate Partner Violence: Not on file   Past Surgical History:  Procedure Laterality Date  . NO PAST SURGERIES     Family History  Problem Relation Age of Onset  . Healthy Mother   . Healthy Father     Current Outpatient Medications:  .  amphetamine-dextroamphetamine (ADDERALL XR) 20 MG 24 hr capsule, Take 20 mg by mouth every morning., Disp: , Rfl:  .  sertraline (ZOLOFT) 50 MG tablet, Take by mouth., Disp: , Rfl:   Allergies  Allergen Reactions  . Celebrex [Celecoxib] Hives     ROS: Review of Systems ROS  As per HPI   Physical exam    09/08/2022    1:43 PM 11/27/2021    2:27 PM 04/25/2021    9:17 AM  Vitals with BMI  Height 5\' 3"  5\' 3"  5\' 3"   Weight 148 lbs 147 lbs 8  oz 148 lbs  BMI 26.22 26.14 26.22  Systolic 110 124 409  Diastolic 62 80 79  Pulse 90 99 102    Physical Exam    Assessment/ Plan: Theresa Watts here for annual physical exam.     Discussed with patient to continue healthy lifestyle choices, including diet (rich in fruits, vegetables, and lean proteins, and low in salt and simple carbohydrates) and exercise (at least 30 minutes of moderate physical activity daily). Limit beverages high is sugar. Recommended at least 80-100 oz of water daily.   Patient to follow up in 1 year for annual exam or sooner if needed.  The above assessment and management plan was discussed with the patient. The patient verbalized  understanding of and has agreed to the management plan. Patient is aware to call the clinic if symptoms persist or worsen. Patient is aware when to return to the clinic for a follow-up visit. Patient educated on when it is appropriate to go to the emergency department.   Neale Burly, DNP-FNP [facility] [address] [place], [place] [address] [phone]

## 2022-09-08 NOTE — Patient Instructions (Signed)

## 2022-09-11 LAB — CBC WITH DIFFERENTIAL/PLATELET
Basophils Absolute: 0 10*3/uL (ref 0.0–0.2)
Basos: 0 %
EOS (ABSOLUTE): 0.1 10*3/uL (ref 0.0–0.4)
Eos: 1 %
Hematocrit: 40.4 % (ref 34.0–46.6)
Hemoglobin: 14.1 g/dL (ref 11.1–15.9)
Immature Grans (Abs): 0 10*3/uL (ref 0.0–0.1)
Immature Granulocytes: 0 %
Lymphocytes Absolute: 2.2 10*3/uL (ref 0.7–3.1)
Lymphs: 24 %
MCH: 32 pg (ref 26.6–33.0)
MCHC: 34.9 g/dL (ref 31.5–35.7)
MCV: 92 fL (ref 79–97)
Monocytes Absolute: 0.4 10*3/uL (ref 0.1–0.9)
Monocytes: 5 %
Neutrophils Absolute: 6.4 10*3/uL (ref 1.4–7.0)
Neutrophils: 70 %
Platelets: 260 10*3/uL (ref 150–450)
RBC: 4.4 x10E6/uL (ref 3.77–5.28)
RDW: 12.3 % (ref 11.7–15.4)
WBC: 9.2 10*3/uL (ref 3.4–10.8)

## 2022-09-11 LAB — CMP14+EGFR
ALT: 17 IU/L (ref 0–32)
AST: 15 IU/L (ref 0–40)
Albumin/Globulin Ratio: 1.7 (ref 1.2–2.2)
Albumin: 4.3 g/dL (ref 3.9–4.9)
Alkaline Phosphatase: 74 IU/L (ref 44–121)
BUN/Creatinine Ratio: 10 (ref 9–23)
BUN: 7 mg/dL (ref 6–20)
Bilirubin Total: 0.6 mg/dL (ref 0.0–1.2)
CO2: 23 mmol/L (ref 20–29)
Calcium: 9.4 mg/dL (ref 8.7–10.2)
Chloride: 104 mmol/L (ref 96–106)
Creatinine, Ser: 0.73 mg/dL (ref 0.57–1.00)
Globulin, Total: 2.5 g/dL (ref 1.5–4.5)
Glucose: 86 mg/dL (ref 70–99)
Potassium: 4.3 mmol/L (ref 3.5–5.2)
Sodium: 140 mmol/L (ref 134–144)
Total Protein: 6.8 g/dL (ref 6.0–8.5)
eGFR: 111 mL/min/{1.73_m2} (ref >59)

## 2022-09-11 LAB — QUANTIFERON-TB GOLD PLUS
QuantiFERON Mitogen Value: >10 IU/mL
QuantiFERON Nil Value: 0.02 IU/mL
QuantiFERON TB1 Ag Value: 0.03 IU/mL
QuantiFERON TB2 Ag Value: 0.03 IU/mL
QuantiFERON-TB Gold Plus: NEGATIVE

## 2022-09-11 LAB — MEASLES/MUMPS/RUBELLA IMMUNITY
MUMPS ABS, IGG: <9 AU/mL — ABNORMAL LOW (ref >10.9)
RUBEOLA AB, IGG: <13.5 AU/mL — ABNORMAL LOW (ref >16.4)
Rubella Antibodies, IGG: <0.9 index — ABNORMAL LOW (ref >0.99)

## 2022-09-11 LAB — THYROID PANEL WITH TSH
Free Thyroxine Index: 1.4 (ref 1.2–4.9)
T3 Uptake Ratio: 28 % (ref 24–39)
T4, Total: 5 ug/dL (ref 4.5–12.0)
TSH: 0.5 u[IU]/mL (ref 0.450–4.500)

## 2022-09-11 LAB — VARICELLA ZOSTER ANTIBODY, IGG: Varicella zoster IgG: 324 index (ref >165)

## 2022-10-21 ENCOUNTER — Encounter: Payer: Self-pay | Admitting: Family Medicine

## 2022-10-21 ENCOUNTER — Telehealth: Payer: Self-pay | Admitting: Family Medicine

## 2022-10-21 ENCOUNTER — Ambulatory Visit (INDEPENDENT_AMBULATORY_CARE_PROVIDER_SITE_OTHER): Payer: 59 | Admitting: Family Medicine

## 2022-10-21 VITALS — BP 117/72 | HR 99 | Temp 98.0°F | Ht 63.0 in | Wt 145.2 lb

## 2022-10-21 DIAGNOSIS — N3001 Acute cystitis with hematuria: Secondary | ICD-10-CM

## 2022-10-21 DIAGNOSIS — R112 Nausea with vomiting, unspecified: Secondary | ICD-10-CM

## 2022-10-21 DIAGNOSIS — R109 Unspecified abdominal pain: Secondary | ICD-10-CM

## 2022-10-21 DIAGNOSIS — R110 Nausea: Secondary | ICD-10-CM | POA: Diagnosis not present

## 2022-10-21 DIAGNOSIS — R6883 Chills (without fever): Secondary | ICD-10-CM

## 2022-10-21 DIAGNOSIS — M549 Dorsalgia, unspecified: Secondary | ICD-10-CM

## 2022-10-21 DIAGNOSIS — R300 Dysuria: Secondary | ICD-10-CM | POA: Diagnosis not present

## 2022-10-21 LAB — URINALYSIS, COMPLETE
Bilirubin, UA: NEGATIVE
Glucose, UA: NEGATIVE
Ketones, UA: NEGATIVE
Nitrite, UA: NEGATIVE
Protein,UA: NEGATIVE
Specific Gravity, UA: 1.025 (ref 1.005–1.030)
Urobilinogen, Ur: 0.2 mg/dL (ref 0.2–1.0)
pH, UA: 5.5 (ref 5.0–7.5)

## 2022-10-21 LAB — MICROSCOPIC EXAMINATION: Renal Epithel, UA: NONE SEEN /hpf

## 2022-10-21 MED ORDER — CEFTRIAXONE SODIUM 1 G IJ SOLR
1.0000 g | Freq: Once | INTRAMUSCULAR | Status: AC
Start: 2022-10-21 — End: 2022-10-21
  Administered 2022-10-21: 1 g via INTRAMUSCULAR

## 2022-10-21 MED ORDER — SULFAMETHOXAZOLE-TRIMETHOPRIM 800-160 MG PO TABS
1.0000 | ORAL_TABLET | Freq: Two times a day (BID) | ORAL | 0 refills | Status: AC
Start: 2022-10-21 — End: 2022-10-28

## 2022-10-21 MED ORDER — ONDANSETRON HCL 4 MG PO TABS: 4.0000 mg | ORAL_TABLET | Freq: Three times a day (TID) | ORAL | 0 refills | Status: DC | PRN

## 2022-10-21 NOTE — Addendum Note (Signed)
Addended by: Adella Hare B on: 10/21/2022 02:16 PM   Modules accepted: Orders

## 2022-10-21 NOTE — Patient Instructions (Addendum)
  [name], thank you for joining Freddy Finner, NP for today's virtual visit.  While this provider is not your primary care provider (PCP), if your PCP is located in our provider database this encounter information will be shared with them immediately following your visit.   A [facility] MyChart account gives you access to today's visit and all your visits, tests, and labs performed at [facility] " click here if you don't have a [facility] MyChart account or go to [email or web address]  Consent: (Patient) [name] provided verbal consent for this virtual visit at the beginning of the encounter.  Current Medications:  Current Outpatient Medications:    adapalene (DIFFERIN) 0.1 % cream, Apply topically at bedtime., Disp: 45 g, Rfl: 0   amphetamine-dextroamphetamine (ADDERALL XR) 20 MG 24 hr capsule, Take 20 mg by mouth every morning., Disp: , Rfl:    sertraline (ZOLOFT) 50 MG tablet, Take by mouth., Disp: , Rfl:    Medications ordered in this encounter:  No orders of the defined types were placed in this encounter.    *If you need refills on other medications prior to your next appointment, please contact your pharmacy*  Follow-Up: Call back or seek an in-person evaluation if the symptoms worsen or if the condition fails to improve as anticipated.  [facility] [phone]  Other Instructions  Please go be seen in person to kidney infection ruled out.     If you have been instructed to have an in-person evaluation today at a local Urgent Care facility, please use the link below. It will take you to a list of all of our available [facility], including address, phone number and hours of operation. Please do not delay care.  [facility]  If you or a family member do not have a primary care provider, use the link below to schedule a visit and establish care. When you choose a [facility] primary care  physician or advanced practice provider, you gain a long-term partner in health. Find a Primary Care Provider  Learn more about [facility]'s in-office and virtual care options: [facility] - Get Care Now

## 2022-10-21 NOTE — Progress Notes (Signed)
Subjective:  Patient ID: Theresa Watts, female    DOB: 27-Jun-1990, 32 y.o.   MRN: [id number]  Patient Care Team: [name], FNP as PCP - General (Family Medicine)   Chief Complaint:  Dysuria, BACK PAIN, Abdominal Pain, NAUSEA , Emesis, and Chills   HPI: Theresa Watts is a 32 y.o. female presenting on 10/21/2022 for Dysuria, BACK PAIN, Abdominal Pain, NAUSEA , Emesis, and Chills   Dysuria  This is a new problem. Episode onset: 0330 this morning. The problem occurs every urination. The problem has been unchanged. The quality of the pain is described as aching, burning and shooting. The pain is severe. She is Sexually active. There is No history of pyelonephritis. Associated symptoms include chills, flank pain, nausea, urgency and vomiting. Pertinent negatives include no discharge, frequency, hematuria, hesitancy, possible pregnancy or sweats. She has tried nothing for the symptoms.  Abdominal Pain This is a new problem. Episode onset: 0330. The onset quality is sudden. The problem occurs constantly. The problem has been unchanged. The pain is located in the suprapubic region, right flank and left flank. The pain is moderate. Associated symptoms include dysuria, nausea and vomiting. Pertinent negatives include no constipation, diarrhea, fever, frequency, headaches or hematuria.  Emesis  Associated symptoms include abdominal pain and chills. Pertinent negatives include no chest pain, coughing, diarrhea, dizziness, fever, headaches or sweats.      Relevant past medical, surgical, family, and social history reviewed and updated as indicated.  Allergies and medications reviewed and updated. Data reviewed: Chart in Epic.   Past Medical History:  Diagnosis Date   Medical history non-contributory     Past Surgical History:  Procedure Laterality Date   NO PAST SURGERIES      Social History   Socioeconomic History   Marital status: Single    Spouse name: Not on  file   Number of children: Not on file   Years of education: Not on file   Highest education level: Not on file  Occupational History   Not on file  Tobacco Use   Smoking status: Never   Smokeless tobacco: Never  Vaping Use   Vaping Use: Never used  Substance and Sexual Activity   Alcohol use: No   Drug use: No   Sexual activity: Yes  Other Topics Concern   Not on file  Social History Narrative   Not on file   Social Determinants of Health   Financial Resource Strain: Not on file  Food Insecurity: Not on file  Transportation Needs: Not on file  Physical Activity: Not on file  Stress: Not on file  Social Connections: Not on file  Intimate Partner Violence: Not on file    Outpatient Encounter Medications as of 10/21/2022  Medication Sig   adapalene (DIFFERIN) 0.1 % cream Apply topically at bedtime.   amphetamine-dextroamphetamine (ADDERALL XR) 20 MG 24 hr capsule Take 20 mg by mouth every morning.   ondansetron (ZOFRAN) 4 MG tablet Take 1 tablet (4 mg total) by mouth every 8 (eight) hours as needed for nausea or vomiting.   sertraline (ZOLOFT) 50 MG tablet Take by mouth.   sulfamethoxazole-trimethoprim (BACTRIM DS) 800-160 MG tablet Take 1 tablet by mouth 2 (two) times daily for 7 days.   No facility-administered encounter medications on file as of 10/21/2022.    Allergies  Allergen Reactions   Celebrex [Celecoxib] Hives    Review of Systems  Constitutional:  Positive for activity change, appetite change and chills. Negative  for diaphoresis, fatigue, fever and unexpected weight change.  Respiratory:  Negative for cough and shortness of breath.   Cardiovascular:  Negative for chest pain, palpitations and leg swelling.  Gastrointestinal:  Positive for abdominal pain, nausea and vomiting. Negative for abdominal distention, anal bleeding, blood in stool, constipation, diarrhea and rectal pain.  Genitourinary:  Positive for dysuria, flank pain and urgency. Negative for  decreased urine volume, difficulty urinating, dyspareunia, enuresis, frequency, genital sores, hematuria, hesitancy, menstrual problem, pelvic pain, vaginal bleeding, vaginal discharge and vaginal pain.  Neurological:  Negative for dizziness, tremors, seizures, syncope, facial asymmetry, speech difficulty, weakness, light-headedness, numbness and headaches.  Psychiatric/Behavioral:  Negative for confusion.   All other systems reviewed and are negative.       Objective:  BP 117/72   Pulse 99   Temp 98 F (36.7 C)   Ht 5\' 3"  (1.6 m)   Wt 145 lb 3.2 oz (65.9 kg)   SpO2 97%   BMI 25.72 kg/m    Wt Readings from Last 3 Encounters:  10/21/22 145 lb 3.2 oz (65.9 kg)  09/08/22 148 lb (67.1 kg)  11/27/21 147 lb 8 oz (66.9 kg)    Physical Exam Vitals and nursing note reviewed.  Constitutional:      Appearance: Normal appearance. She is well-developed and well-groomed. She is not ill-appearing, toxic-appearing or diaphoretic.     Comments: Uncomfortable  HENT:     Head: Normocephalic and atraumatic.     Jaw: There is normal jaw occlusion.     Right Ear: Hearing normal.     Left Ear: Hearing normal.     Nose: Nose normal.     Mouth/Throat:     Lips: Pink.     Mouth: Mucous membranes are moist.     Pharynx: Oropharynx is clear. Uvula midline.  Eyes:     General: Lids are normal.     Extraocular Movements: Extraocular movements intact.     Conjunctiva/sclera: Conjunctivae normal.     Pupils: Pupils are equal, round, and reactive to light.  Neck:     Thyroid: No thyroid mass, thyromegaly or thyroid tenderness.     Vascular: No carotid bruit or JVD.     Trachea: Trachea and phonation normal.  Cardiovascular:     Rate and Rhythm: Normal rate and regular rhythm.     Chest Wall: PMI is not displaced.     Pulses: Normal pulses.     Heart sounds: Normal heart sounds. No murmur heard.    No friction rub. No gallop.  Pulmonary:     Effort: Pulmonary effort is normal. No respiratory  distress.     Breath sounds: Normal breath sounds. No wheezing.  Abdominal:     General: Bowel sounds are normal. There is no distension or abdominal bruit.     Palpations: Abdomen is soft. There is no hepatomegaly, splenomegaly or mass.     Tenderness: There is abdominal tenderness in the suprapubic area. There is right CVA tenderness. There is no left CVA tenderness, guarding or rebound.     Hernia: No hernia is present.  Musculoskeletal:        General: Normal range of motion.     Cervical back: Normal range of motion and neck supple.     Right lower leg: No edema.     Left lower leg: No edema.  Lymphadenopathy:     Cervical: No cervical adenopathy.  Skin:    General: Skin is warm and dry.     Capillary  Refill: Capillary refill takes less than 2 seconds.     Coloration: Skin is not cyanotic, jaundiced or pale.     Findings: No rash.  Neurological:     General: No focal deficit present.     Mental Status: She is alert and oriented to person, place, and time.     Sensory: Sensation is intact.     Motor: Motor function is intact.     Coordination: Coordination is intact.     Gait: Gait is intact.     Deep Tendon Reflexes: Reflexes are normal and symmetric.  Psychiatric:        Attention and Perception: Attention and perception normal.        Mood and Affect: Mood and affect normal.        Speech: Speech normal.        Behavior: Behavior normal. Behavior is cooperative.        Thought Content: Thought content normal.        Cognition and Memory: Cognition and memory normal.        Judgment: Judgment normal.     Results for orders placed or performed in visit on 09/08/22  Thyroid Panel With TSH  Result Value Ref Range   TSH 0.500 0.450 - 4.500 uIU/mL   T4, Total 5.0 4.5 - 12.0 ug/dL   T3 Uptake Ratio 28 24 - 39 %   Free Thyroxine Index 1.4 1.2 - 4.9  CBC with Differential/Platelet  Result Value Ref Range   WBC 9.2 3.4 - 10.8 x10E3/uL   RBC 4.40 3.77 - 5.28 x10E6/uL    Hemoglobin 14.1 11.1 - 15.9 g/dL   Hematocrit 29.5 62.1 - 46.6 %   MCV 92 79 - 97 fL   MCH 32.0 26.6 - 33.0 pg   MCHC 34.9 31.5 - 35.7 g/dL   RDW 30.8 65.7 - 84.6 %   Platelets 260 150 - 450 x10E3/uL   Neutrophils 70 Not Estab. %   Lymphs 24 Not Estab. %   Monocytes 5 Not Estab. %   Eos 1 Not Estab. %   Basos 0 Not Estab. %   Neutrophils Absolute 6.4 1.4 - 7.0 x10E3/uL   Lymphocytes Absolute 2.2 0.7 - 3.1 x10E3/uL   Monocytes Absolute 0.4 0.1 - 0.9 x10E3/uL   EOS (ABSOLUTE) 0.1 0.0 - 0.4 x10E3/uL   Basophils Absolute 0.0 0.0 - 0.2 x10E3/uL   Immature Granulocytes 0 Not Estab. %   Immature Grans (Abs) 0.0 0.0 - 0.1 x10E3/uL  CMP14+EGFR  Result Value Ref Range   Glucose 86 70 - 99 mg/dL   BUN 7 6 - 20 mg/dL   Creatinine, Ser 9.62 0.57 - 1.00 mg/dL   eGFR 952 >84 XL/KGM/0.10   BUN/Creatinine Ratio 10 9 - 23   Sodium 140 134 - 144 mmol/L   Potassium 4.3 3.5 - 5.2 mmol/L   Chloride 104 96 - 106 mmol/L   CO2 23 20 - 29 mmol/L   Calcium 9.4 8.7 - 10.2 mg/dL   Total Protein 6.8 6.0 - 8.5 g/dL   Albumin 4.3 3.9 - 4.9 g/dL   Globulin, Total 2.5 1.5 - 4.5 g/dL   Albumin/Globulin Ratio 1.7 1.2 - 2.2   Bilirubin Total 0.6 0.0 - 1.2 mg/dL   Alkaline Phosphatase 74 44 - 121 IU/L   AST 15 0 - 40 IU/L   ALT 17 0 - 32 IU/L  Measles/Mumps/Rubella Immunity  Result Value Ref Range   Rubella Antibodies, IGG <0.90 (L) Immune >0.99 index  RUBEOLA AB, IGG <13.5 (L) Immune >16.4 AU/mL   MUMPS ABS, IGG <9.0 (L) Immune >10.9 AU/mL  Varicella zoster antibody, IgG  Result Value Ref Range   Varicella zoster IgG 324 Immune >165 index  QuantiFERON-TB Gold Plus  Result Value Ref Range   QuantiFERON Incubation Incubation performed.    QuantiFERON Criteria Comment    QuantiFERON TB1 Ag Value 0.03 IU/mL   QuantiFERON TB2 Ag Value 0.03 IU/mL   QuantiFERON Nil Value 0.02 IU/mL   QuantiFERON Mitogen Value >10.00 IU/mL   QuantiFERON-TB Gold Plus Negative Negative       Pertinent labs & imaging  results that were available during my care of the patient were reviewed by me and considered in my medical decision making.  Assessment & Plan:  Theresa Watts was seen today for dysuria, back pain, abdominal pain, nausea , emesis and chills.  Diagnoses and all orders for this visit:  Dysuria Flank pain Nausea Urinalysis with leukocytes, blood and bacteria. Symptoms concerning for pyelonephritis, pt does not wish to go to the hospital. Would liked to trial outpatient treatment. Discussed all red flags in detail and pt aware to go to ED if any new or worsening symptoms present. Zofran as needed.  -     Urinalysis, Complete -     Urine Culture -     ondansetron (ZOFRAN) 4 MG tablet; Take 1 tablet (4 mg total) by mouth every 8 (eight) hours as needed for nausea or vomiting.  Acute cystitis with hematuria Rocephin 1 gm IM in office, bactrim orally. Aware to go to ED if any new, worsening, or persistent symptoms occur.  -     Urinalysis, Complete -     Urine Culture -     sulfamethoxazole-trimethoprim (BACTRIM DS) 800-160 MG tablet; Take 1 tablet by mouth 2 (two) times daily for 7 days.     Continue all other maintenance medications.  Follow up plan: Return if symptoms worsen or fail to improve, for 1-2 weeks for revaluation, sooner if warranted .   Continue healthy lifestyle choices, including diet (rich in fruits, vegetables, and lean proteins, and low in salt and simple carbohydrates) and exercise (at least 30 minutes of moderate physical activity daily).  Educational handout given for UTI  The above assessment and management plan was discussed with the patient. The patient verbalized understanding of and has agreed to the management plan. Patient is aware to call the clinic if they develop any new symptoms or if symptoms persist or worsen. Patient is aware when to return to the clinic for a follow-up visit. Patient educated on when it is appropriate to go to the emergency department.    Kari Baars, FNP-C [facility] [phone]

## 2022-10-21 NOTE — Progress Notes (Signed)
Virtual Visit Consent   Theresa Watts, you are scheduled for a virtual visit with a [facility] provider today. Just as with appointments in the office, your consent must be obtained to participate. Your consent will be active for this visit and any virtual visit you may have with one of our providers in the next 365 days. If you have a MyChart account, a copy of this consent can be sent to you electronically.  As this is a virtual visit, video technology does not allow for your provider to perform a traditional examination. This may limit your provider's ability to fully assess your condition. If your provider identifies any concerns that need to be evaluated in person or the need to arrange testing (such as labs, EKG, etc.), we will make arrangements to do so. Although advances in technology are sophisticated, we cannot ensure that it will always work on either your end or our end. If the connection with a video visit is poor, the visit may have to be switched to a telephone visit. With either a video or telephone visit, we are not always able to ensure that we have a secure connection.  By engaging in this virtual visit, you consent to the provision of healthcare and authorize for your insurance to be billed (if applicable) for the services provided during this visit. Depending on your insurance coverage, you may receive a charge related to this service.  I need to obtain your verbal consent now. Are you willing to proceed with your visit today? Theresa Watts has provided verbal consent on 10/21/2022 for a virtual visit (video or telephone). Freddy Finner, NP  Date: 10/21/2022 8:07 AM  Virtual Visit via Video Note   I, Freddy Finner, connected with  Theresa Watts  ([id number], 1991-04-16) on 10/21/22 at  8:15 AM EDT by a video-enabled telemedicine application and verified that I am speaking with the correct person using two identifiers.  Location: Patient: Virtual Visit  Location Patient: Home Provider: Virtual Visit Location Provider: Home Office   I discussed the limitations of evaluation and management by telemedicine and the availability of in person appointments. The patient expressed understanding and agreed to proceed.    History of Present Illness: Theresa Watts is a 32 y.o. who identifies as a female who was assigned female at birth, and is being seen today for UTI   Onset was 330 this morning with chills, back pain and abdomen pain Associated symptoms are nausea and vomiting  Modifying factors are none Denies chest pain, shortness of breath   Problems:  Patient Active Problem List   Diagnosis Date Noted   Multinodular goiter 09/22/2018   Thyroid nodule 09/22/2018   Depression, recurrent (HCC) 08/29/2018   Encounter for surveillance of injectable contraceptive 11/11/2016   GAD (generalized anxiety disorder) 06/08/2016    Allergies:  Allergies  Allergen Reactions   Celebrex [Celecoxib] Hives   Medications:  Current Outpatient Medications:    adapalene (DIFFERIN) 0.1 % cream, Apply topically at bedtime., Disp: 45 g, Rfl: 0   amphetamine-dextroamphetamine (ADDERALL XR) 20 MG 24 hr capsule, Take 20 mg by mouth every morning., Disp: , Rfl:    sertraline (ZOLOFT) 50 MG tablet, Take by mouth., Disp: , Rfl:   Observations/Objective: Patient is well-developed, well-nourished in no acute distress.  Resting comfortably  at home.  Head is normocephalic, atraumatic.  No labored breathing.  Speech is clear and coherent with logical content.  Patient is alert and oriented at baseline.  Assessment and Plan:  1. Dysuria   2. Nausea and vomiting, unspecified vomiting type   3. Acute back pain, unspecified back location, unspecified back pain laterality   4. Chills   -advised she needs in person care to rule out kidney infection  Patient acknowledged agreement and understanding of the plan.     Follow Up Instructions: I  discussed the assessment and treatment plan with the patient. The patient was provided an opportunity to ask questions and all were answered. The patient agreed with the plan and demonstrated an understanding of the instructions.  A copy of instructions were sent to the patient via MyChart unless otherwise noted below.     The patient was advised to call back or seek an in-person evaluation if the symptoms worsen or if the condition fails to improve as anticipated.  Time:  I spent 2 minutes with the patient via telehealth technology discussing the above problems/concerns.    Freddy Finner, NP

## 2022-10-22 ENCOUNTER — Encounter: Payer: Self-pay | Admitting: Family Medicine

## 2022-10-23 LAB — URINE CULTURE

## 2022-10-26 LAB — URINE CULTURE

## 2022-10-28 ENCOUNTER — Ambulatory Visit: Payer: 59 | Admitting: Family Medicine

## 2022-10-28 ENCOUNTER — Encounter: Payer: Self-pay | Admitting: Family Medicine

## 2022-11-12 DIAGNOSIS — F900 Attention-deficit hyperactivity disorder, predominantly inattentive type: Secondary | ICD-10-CM | POA: Diagnosis not present

## 2022-11-12 DIAGNOSIS — F411 Generalized anxiety disorder: Secondary | ICD-10-CM | POA: Diagnosis not present

## 2022-11-24 ENCOUNTER — Ambulatory Visit (INDEPENDENT_AMBULATORY_CARE_PROVIDER_SITE_OTHER): Payer: 59

## 2022-11-24 DIAGNOSIS — Z23 Encounter for immunization: Secondary | ICD-10-CM | POA: Diagnosis not present

## 2023-01-11 DIAGNOSIS — F900 Attention-deficit hyperactivity disorder, predominantly inattentive type: Secondary | ICD-10-CM | POA: Diagnosis not present

## 2023-01-11 DIAGNOSIS — F411 Generalized anxiety disorder: Secondary | ICD-10-CM | POA: Diagnosis not present

## 2023-04-27 ENCOUNTER — Telehealth (INDEPENDENT_AMBULATORY_CARE_PROVIDER_SITE_OTHER): Payer: 59 | Admitting: Family

## 2023-04-27 ENCOUNTER — Encounter: Payer: Self-pay | Admitting: Family

## 2023-04-27 DIAGNOSIS — K8020 Calculus of gallbladder without cholecystitis without obstruction: Secondary | ICD-10-CM | POA: Diagnosis not present

## 2023-04-27 DIAGNOSIS — R1013 Epigastric pain: Secondary | ICD-10-CM | POA: Diagnosis not present

## 2023-04-27 DIAGNOSIS — Z09 Encounter for follow-up examination after completed treatment for conditions other than malignant neoplasm: Secondary | ICD-10-CM

## 2023-04-27 MED ORDER — ONDANSETRON HCL 4 MG PO TABS: 4.0000 mg | ORAL_TABLET | Freq: Three times a day (TID) | ORAL | 0 refills | Status: AC | PRN

## 2023-04-27 MED ORDER — DICYCLOMINE HCL 20 MG PO TABS: 20.0000 mg | ORAL_TABLET | Freq: Four times a day (QID) | ORAL | 2 refills | Status: AC

## 2023-04-27 MED ORDER — HYDROCODONE-ACETAMINOPHEN 5-325 MG PO TABS: 1.0000 | ORAL_TABLET | Freq: Four times a day (QID) | ORAL | 0 refills | Status: DC | PRN

## 2023-04-27 NOTE — Progress Notes (Signed)
 Virtual Visit Consent   Theresa Watts, you are scheduled for a virtual visit with a [facility] provider today. Just as with appointments in the office, your consent must be obtained to participate. Your consent will be active for this visit and any virtual visit you may have with one of our providers in the next 365 days. If you have a MyChart account, a copy of this consent can be sent to you electronically.  As this is a virtual visit, video technology does not allow for your provider to perform a traditional examination. This may limit your provider's ability to fully assess your condition. If your provider identifies any concerns that need to be evaluated in person or the need to arrange testing (such as labs, EKG, etc.), we will make arrangements to do so. Although advances in technology are sophisticated, we cannot ensure that it will always work on either your end or our end. If the connection with a video visit is poor, the visit may have to be switched to a telephone visit. With either a video or telephone visit, we are not always able to ensure that we have a secure connection.  By engaging in this virtual visit, you consent to the provision of healthcare and authorize for your insurance to be billed (if applicable) for the services provided during this visit. Depending on your insurance coverage, you may receive a charge related to this service.  I need to obtain your verbal consent now. Are you willing to proceed with your visit today? [name] has provided verbal consent on 04/27/2023 for a virtual visit (video or telephone). Bari Learn, FNP  Date: 04/27/2023 12:14 PM  Virtual Visit via Video Note   I, Bari Learn, connected with  Theresa Watts  ([id number], May 03, 1990) on 04/27/23 at 12:10 PM EST by a video-enabled telemedicine application and verified that I am speaking with the correct person using two identifiers.  Location: Patient: Virtual Visit  Location Patient: Other: car Provider: Virtual Visit Location Provider: Home Office   I discussed the limitations of evaluation and management by telemedicine and the availability of in person appointments. The patient expressed understanding and agreed to proceed.    History of Present Illness: Theresa Watts is a 33 y.o. who identifies as a female who was assigned female at birth, and is being seen today for hospital follow up. She went to the ED on 04/19/23 with chest pain and epigastric pain. She had a CT scan that showed, Cholelithiasis.  They did a referral to general surgeon 05/13/23. She reports she had another flare up with chest pain, back pain, and epigastric pain that is pressure pain of 10 out 10. Reports it resolved after 3 hours.   She was started on Nexium 40 mg, zofran , and Bentyl  20 mg QID.    HPI: HPI  Problems:  Patient Active Problem List   Diagnosis Date Noted   Multinodular goiter 09/22/2018   Thyroid  nodule 09/22/2018   Depression, recurrent (HCC) 08/29/2018   Encounter for surveillance of injectable contraceptive 11/11/2016   GAD (generalized anxiety disorder) 06/08/2016    Allergies:  Allergies  Allergen Reactions   Celebrex [Celecoxib] Hives   Medications:  Current Outpatient Medications:    dicyclomine  (BENTYL ) 20 MG tablet, Take 1 tablet (20 mg total) by mouth every 6 (six) hours., Disp: 120 tablet, Rfl: 2   HYDROcodone -acetaminophen  (NORCO) 5-325 MG tablet, Take 1 tablet by mouth every 6 (six) hours as needed for moderate pain (pain  score 4-6)., Disp: 15 tablet, Rfl: 0   ondansetron  (ZOFRAN ) 4 MG tablet, Take 1 tablet (4 mg total) by mouth every 8 (eight) hours as needed for nausea or vomiting., Disp: 20 tablet, Rfl: 0   adapalene  (DIFFERIN ) 0.1 % cream, Apply topically at bedtime., Disp: 45 g, Rfl: 0   amphetamine -dextroamphetamine  (ADDERALL XR) 20 MG 24 hr capsule, Take 20 mg by mouth every morning., Disp: , Rfl:    sertraline (ZOLOFT) 50 MG  tablet, Take by mouth., Disp: , Rfl:   Observations/Objective: Patient is well-developed, well-nourished in no acute distress.  Resting comfortably  Head is normocephalic, atraumatic.  No labored breathing. Speech is clear and coherent with logical content.  Patient is alert and oriented at baseline.  Mild epigastric pain with palpation   Assessment and Plan: 1. Calculus of gallbladder without cholecystitis without obstruction (Primary) - ondansetron  (ZOFRAN ) 4 MG tablet; Take 1 tablet (4 mg total) by mouth every 8 (eight) hours as needed for nausea or vomiting.  Dispense: 20 tablet; Refill: 0 - dicyclomine  (BENTYL ) 20 MG tablet; Take 1 tablet (20 mg total) by mouth every 6 (six) hours.  Dispense: 120 tablet; Refill: 2 - HYDROcodone -acetaminophen  (NORCO) 5-325 MG tablet; Take 1 tablet by mouth every 6 (six) hours as needed for moderate pain (pain score 4-6).  Dispense: 15 tablet; Refill: 0  2. Hospital discharge follow-up  3. Epigastric pain - ondansetron  (ZOFRAN ) 4 MG tablet; Take 1 tablet (4 mg total) by mouth every 8 (eight) hours as needed for nausea or vomiting.  Dispense: 20 tablet; Refill: 0 - dicyclomine  (BENTYL ) 20 MG tablet; Take 1 tablet (20 mg total) by mouth every 6 (six) hours.  Dispense: 120 tablet; Refill: 2 - HYDROcodone -acetaminophen  (NORCO) 5-325 MG tablet; Take 1 tablet by mouth every 6 (six) hours as needed for moderate pain (pain score 4-6).  Dispense: 15 tablet; Refill: 0  Hospital notes reviewed Will give a small amount of Norco for patient when attacks occur. She has appointment with surgeon in two weeks.  Bland diet Avoid fatty, greasy foods  Continue nexium and Bentyl   If pain worsen or fever go to ED  Follow Up Instructions: I discussed the assessment and treatment plan with the patient. The patient was provided an opportunity to ask questions and all were answered. The patient agreed with the plan and demonstrated an understanding of the instructions.  A  copy of instructions were sent to the patient via MyChart unless otherwise noted below.     The patient was advised to call back or seek an in-person evaluation if the symptoms worsen or if the condition fails to improve as anticipated.    Bari Learn, FNP

## 2023-04-27 NOTE — Patient Instructions (Signed)
Cholelithiasis  Cholelithiasis is a disease in which gallstones form in the gallbladder. The gallbladder is an organ that stores bile. Bile is a fluid that helps to digest fats. Gallstones begin as small crystals and can slowly grow into stones. They may cause no symptoms until they block the gallbladder duct, or cystic duct, when the gallbladder tightens, or contracts, after food is eaten. This can cause pain and is known as a gallbladder attack, or biliary colic. There are two main types of gallstones: Cholesterol stones. These are the most common type of gallstone. These stones are made of hardened cholesterol and are usually yellow-green in color. Pigment stones. These are dark in color and are made of a red-yellow substance, called bilirubin,that forms when hemoglobin from red blood cells breaks down. What are the causes? This condition may be caused by too little or too much of the substances that are in bile. This can happen if the bile: Has too much bilirubin. This can happen in certain blood diseases, such as sickle cell anemia. Has too much cholesterol. Does not have enough bile salts. These salts help the body absorb and digest fats. It can also happen if the gallbladder is not emptying completely. This is common during pregnancy. What increases the risk? The following factors may make you more likely to develop this condition: Being older than 35 years of age. Eating a diet that is heavy in fried foods, fat, and refined carbohydrates, such as white bread and white rice. Being female. Having multiple pregnancies. Using medicines that contain female hormones (estrogen) for a long time. Having certain medical problems, such as: Diabetes mellitus. Obesity. Cystic fibrosis. Crohn's disease. Cirrhosis or other long-term (chronic) liver disease. Certain blood diseases, such as sickle cell anemia or leukemia. Having a family history of gallstones. Losing weight quickly. What are the  signs or symptoms? In many cases, having gallstones causes no symptoms. These are called silent gallstones. If a gallstone blocks your bile duct, it can cause a gallbladder attack. The main symptom of a gallbladder attack is sudden pain in the upper right part of the abdomen. The pain: Usually comes at night or after eating. Can last for one hour or more. Can spread to your right shoulder, back, or chest. Can feel like indigestion. This is discomfort, burning, or fullness in your upper abdomen. If the bile duct is blocked for more than a few hours, it can cause an infection or inflammation of your gallbladder (cholecystitis), liver, or pancreas. This can cause: Nausea or vomiting. Bloating. Pain in your abdomen that lasts for 5 hours or longer. Tenderness in your upper abdomen, often in the upper right section and under your rib cage. Fever or chills. Skin or the white parts of your eyes turning yellow (jaundice). This usually happens when a stone has blocked bile from passing through the bile duct. Dark pee (urine) or light-colored poop (stools). How is this diagnosed? This condition may be diagnosed based on: A physical exam. Your medical history. Ultrasound. CT scan. MRI. You may also have other tests, including: Blood tests to check for infection or inflammation. The HIDA scan to see the gallbladder and the bile ducts. An endoscope to check for blockage in the bile ducts. How is this treated? Treatment depends on the severity of your symptoms. Silent gallstones do not need treatment. You may need treatment if a blockage causes a gallbladder attack or other symptoms. Treatment may include: If symptoms are mild, you may care for yourself at home.  For mild symptoms: Stop eating and drinking for 12-24 hours. You may drink water and clear liquids. This helps to "cool down" your gallbladder. After 1 or 2 days, eat a diet of simple or clear foods, such as broths and crackers. Take medicines  for pain or nausea. Take antibiotics if you have an infection. If symptoms are severe, you may: Stay in the hospital for pain control or to treat severe infection. Have surgery to remove the gallbladder (cholecystectomy). This is the most common treatment if all other treatments have not worked. Take medicines to break up gallstones. Medicines may be used for up to 6-12 months. Have an procedure to capture and remove gallstones. Follow these instructions at home: Medicines Take over-the-counter and prescription medicines only as told by your health care provider. If you were prescribed antibiotics, take them as told by your provider. Do not stop using the antibiotic even if you start to feel better. Ask your provider if the medicine prescribed to you requires you to avoid driving or using machinery. Eating and drinking Drink enough fluid to keep your pee pale yellow. This is important during a gallbladder attack. Water and clear liquids are preferred. Follow a healthy diet. This includes: Reducing fatty foods, such as fried food and foods high in cholesterol. Reducing refined carbohydrates, such as white bread and white rice. Eating more fiber. Aim for foods such as almonds, fruit, and beans. General instructions Do not use any products that contain nicotine or tobacco. These products include cigarettes, chewing tobacco, and vaping devices, such as e-cigarettes. If you need help quitting, ask your provider. Maintain a healthy weight. Keep all follow-up visits. These may include seeing a specialist or a Careers adviser. Where to find more information [employer] and [employer]estive [employer] Diseases: [email or web address] Contact a health care provider if: You think you have had a gallbladder attack. You have been diagnosed with silent gallstones and you develop indigestion or pain in your abdomen. You have pain from a gallbladder attack that lasts for more than 2 hours. You begin to have  attacks more often. You have nausea. You have dark pee or light-colored poop. Get help right away if: You have pain in your abdomen that lasts for more than 5 hours or is getting worse. You have a fever or chills. You have vomiting that does not go away. You develop jaundice. This information is not intended to replace advice given to you by your health care provider. Make sure you discuss any questions you have with your health care provider. Document Revised: 01/19/2022 Document Reviewed: 01/19/2022 Elsevier Patient Education  2024 [employer].

## 2023-05-13 ENCOUNTER — Ambulatory Visit: Payer: 59 | Admitting: Surgery

## 2023-05-13 ENCOUNTER — Encounter: Payer: Self-pay | Admitting: Surgery

## 2023-05-13 VITALS — BP 116/81 | HR 84 | Temp 98.2°F | Resp 14 | Ht 63.0 in | Wt 142.0 lb

## 2023-05-13 DIAGNOSIS — K8020 Calculus of gallbladder without cholecystitis without obstruction: Secondary | ICD-10-CM | POA: Diagnosis not present

## 2023-05-13 NOTE — Progress Notes (Unsigned)
[facility] History and Physical  Reason for Referral: Cholelithiasis Referring Physician: Harlow Mares, FNP  Chief Complaint   New Patient (Initial Visit)     Theresa Watts is a 33 y.o. female.  HPI: Patient presents for evaluation of cholelithiasis.  Starting 2-1/2 weeks ago, she had severe chest pain with shortness of breath.  She was evaluated in the emergency department and did not have any abnormalities noted.  She had a similar episode a week later.  It seems to be brought on with certain foods specifically spicy and fried foods.  She did have nausea without any vomiting at the time.  Past Medical History:  Diagnosis Date   Medical history non-contributory     Past Surgical History:  Procedure Laterality Date   NO PAST SURGERIES      Family History  Problem Relation Age of Onset   Healthy Mother    Healthy Father     Social History   Tobacco Use   Smoking status: Never   Smokeless tobacco: Never  Vaping Use   Vaping status: Never Used  Substance Use Topics   Alcohol use: No   Drug use: No    Medications: {medication reviewed/display:3041432} Allergies as of 05/13/2023       Reactions   Celebrex [celecoxib] Hives        Medication List        Accurate as of May 13, 2023  9:10 AM. If you have any questions, ask your nurse or doctor.          adapalene 0.1 % cream Commonly known as: Differin Apply topically at bedtime.   amphetamine-dextroamphetamine 20 MG 24 hr capsule Commonly known as: ADDERALL XR Take 20 mg by mouth every morning.   dicyclomine 20 MG tablet Commonly known as: BENTYL Take 1 tablet (20 mg total) by mouth every 6 (six) hours.   HYDROcodone-acetaminophen 5-325 MG tablet Commonly known as: Norco Take 1 tablet by mouth every 6 (six) hours as needed for moderate pain (pain score 4-6).   ondansetron 4 MG tablet Commonly known as: Zofran Take 1 tablet (4 mg total) by mouth every 8 (eight)  hours as needed for nausea or vomiting.   sertraline 50 MG tablet Commonly known as: ZOLOFT Take by mouth.         ROS:  {Review of Systems:30496}  Blood pressure 116/81, pulse 84, temperature 98.2 F (36.8 C), temperature source Oral, resp. rate 14, height 5\' 3"  (1.6 m), weight 142 lb (64.4 kg), SpO2 98%, unknown if currently breastfeeding. Physical Exam  Results: No results found for this or any previous visit (from the past 48 hours).  No results found.   Assessment & Plan:  Theresa Watts is a 33 y.o. female with *** -*** -I counseled the patient about the indication, risks and benefits of robotic assisted laparoscopic cholecystectomy.  She understands there is a very small chance for bleeding, infection, injury to normal structures (including common bile duct), conversion to open surgery, persistent symptoms, evolution of postcholecystectomy diarrhea, need for secondary interventions, anesthesia reaction, cardiopulmonary issues and other risks not specifically detailed here. I described the expected recovery, the plan for follow-up and the restrictions during the recovery phase.  All questions were answered.  -Follow up ***  All questions were answered to the satisfaction of the patient.  Theophilus Kinds, DO [facility] [address] [place], [place] [address] [phone] (office)

## 2023-05-18 NOTE — H&P (Signed)
[facility] History and Physical   Reason for Referral: Cholelithiasis Referring Physician: Harlow Mares, FNP   Chief Complaint   New Patient (Initial Visit)        Theresa Watts is a 33 y.o. female.  HPI: Patient presents for evaluation of cholelithiasis.  Starting 2-1/2 weeks ago, she had severe chest pain with shortness of breath.  She was evaluated in the emergency department and did not have any abnormalities noted.  She had a similar episode a week later.  It seems to be brought on with certain foods specifically spicy and fried foods.  She did have nausea without any vomiting at the time.  She will only very rarely gets some epigastric pain with these episodes but denies any right upper quadrant pain.  Her past medical history is significant for GERD and ADD.  She denies use taking anything for her GERD.  She denies use of blood thinning medications.  She denies any history of abdominal surgeries.  She denies use of tobacco products, alcohol, and illicit drugs.       Past Medical History:  Diagnosis Date   Medical history non-contributory                 Past Surgical History:  Procedure Laterality Date   NO PAST SURGERIES                   Family History  Problem Relation Age of Onset   Healthy Mother     Healthy Father            Social History  Social History        Tobacco Use   Smoking status: Never   Smokeless tobacco: Never  Vaping Use   Vaping status: Never Used  Substance Use Topics   Alcohol use: No   Drug use: No        Medications: I have reviewed the patient's current medications. Allergies as of 05/13/2023         Reactions    Celebrex [celecoxib] Hives            Medication List           Accurate as of May 13, 2023  9:10 AM. If you have any questions, ask your nurse or doctor.              adapalene 0.1 % cream Commonly known as: Differin Apply topically at bedtime.     amphetamine-dextroamphetamine 20 MG 24 hr capsule Commonly known as: ADDERALL XR Take 20 mg by mouth every morning.    dicyclomine 20 MG tablet Commonly known as: BENTYL Take 1 tablet (20 mg total) by mouth every 6 (six) hours.    HYDROcodone-acetaminophen 5-325 MG tablet Commonly known as: Norco Take 1 tablet by mouth every 6 (six) hours as needed for moderate pain (pain score 4-6).    ondansetron 4 MG tablet Commonly known as: Zofran Take 1 tablet (4 mg total) by mouth every 8 (eight) hours as needed for nausea or vomiting.    sertraline 50 MG tablet Commonly known as: ZOLOFT Take by mouth.               ROS:  Constitutional: negative for chills, fatigue, and fevers Eyes: negative for visual disturbance and pain Ears, nose, mouth, throat, and face: negative for ear drainage, sore throat, and sinus problems Respiratory: negative for cough, wheezing, and shortness of breath Cardiovascular: negative for chest pain and palpitations Gastrointestinal: positive  for abdominal pain and nausea, negative for reflux symptoms and vomiting Genitourinary:negative for dysuria and frequency Integument/breast: positive for dryness Hematologic/lymphatic: negative for bleeding and lymphadenopathy Musculoskeletal:negative for back pain and neck pain Neurological: negative for dizziness and tremors Endocrine: negative for temperature intolerance   Blood pressure 116/81, pulse 84, temperature 98.2 F (36.8 C), temperature source Oral, resp. rate 14, height 5\' 3"  (1.6 m), weight 142 lb (64.4 kg), SpO2 98%, unknown if currently breastfeeding. Physical Exam Vitals reviewed.  Constitutional:      Appearance: Normal appearance.  HENT:     Head: Normocephalic and atraumatic.  Eyes:     Extraocular Movements: Extraocular movements intact.     Pupils: Pupils are equal, round, and reactive to light.  Cardiovascular:     Rate and Rhythm: Normal rate and regular rhythm.  Pulmonary:     Effort:  Pulmonary effort is normal.     Breath sounds: Normal breath sounds.  Abdominal:     Comments: Abdomen soft, nondistended, no percussion tenderness, nontender to palpation; no rigidity, guarding, rebound tenderness; negative Murphy sign  Musculoskeletal:        General: Normal range of motion.     Cervical back: Normal range of motion.  Skin:    General: Skin is warm and dry.  Neurological:     General: No focal deficit present.     Mental Status: She is alert and oriented to person, place, and time.  Psychiatric:        Mood and Affect: Mood normal.        Behavior: Behavior normal.        Results: Lab Results Last 48 Hours  No results found for this or any previous visit (from the past 48 hours).     Imaging Results (Last 48 hours)  No results found.       Assessment & Plan:  Theresa Watts is a 33 y.o. female who presents for evaluation of cholelithiasis.   -We discussed the pathophysiology of gallbladder disease, and why we recommend surgical excision.  I did explain that gallbladder attacks can present differently in other people and sometimes can present with chest pain and shortness of breath.  I also explained that some of her symptoms could be related to GERD since she is not currently taking anything for reflux. -I counseled the patient about the indication, risks and benefits of robotic assisted laparoscopic cholecystectomy.  She understands there is a very small chance for bleeding, infection, injury to normal structures (including common bile duct), conversion to open surgery, persistent symptoms, evolution of postcholecystectomy diarrhea, need for secondary interventions, anesthesia reaction, cardiopulmonary issues and other risks not specifically detailed here. I described the expected recovery, the plan for follow-up and the restrictions during the recovery phase.  All questions were answered. -Patient tentatively scheduled for surgery on 2/6 -Information  provided to the patient regarding cholelithiasis, cholecystitis, cholecystectomy, and low-fat diet -Advised that she needs to present to the emergency department if she begins to have worsening abdominal pain, nausea, or vomiting   All questions were answered to the satisfaction of the patient.   Theophilus Kinds, DO [facility] [address] [place], [place] [address] [phone] (office)

## 2023-05-21 NOTE — Patient Instructions (Signed)
Your procedure is scheduled on: 05/27/2023  Report to [facility] Main Entrance at   6:00  AM.  Call this number if you have problems the morning of surgery: [phone]   Remember:   Do not Eat after midnight, you may have CLEAR liquids until 4:30 am Water, tea, Black coffee, NO CREAM        No Smoking the morning of surgery  :  Take these medicines the morning of surgery with A SIP OF WATER: Zoloft             hydrocodone and/or zofran if needed   Do not wear jewelry, make-up or nail polish.  Do not wear lotions, powders, or perfumes. You may wear deodorant.  Do not shave 48 hours prior to surgery. Men may shave face and neck.  Do not bring valuables to the hospital.  Contacts, dentures or bridgework may not be worn into surgery.  Leave suitcase in the car. After surgery it may be brought to your room.  For patients admitted to the hospital, checkout time is 11:00 AM the day of discharge.   Patients discharged the day of surgery will not be allowed to drive home.    Special Instructions: Shower using CHG night before surgery and shower the day of surgery use CHG.  Use special wash - you have one bottle of CHG for all showers.  You should use approximately 1/2 of the bottle for each shower. How to Use Chlorhexidine at Home in the Shower Chlorhexidine gluconate (CHG) is a germ-killing (antiseptic) wash that's used to clean the skin. It can get rid of the germs that normally live on the skin and can keep them away for about 24 hours. If you're having surgery, you may be told to shower with CHG at home the night before surgery. This can help lower your risk for infection. To use CHG wash in the shower, follow the steps below. Supplies needed: CHG body wash. Clean washcloth. Clean towel. How to use CHG in the shower Follow these steps unless you're told to use CHG in a different way: Start the shower. Use your normal soap and shampoo to wash your face and hair. Turn off the shower  or move out of the shower stream. Pour CHG onto a clean washcloth. Do not use any type of brush or rough sponge. Start at your neck, washing your body down to your toes. Make sure you: Wash the part of your body where the surgery will be done for at least 1 minute. Do not scrub. Do not use CHG on your head or face unless your health care provider tells you to. If it gets into your ears or eyes, rinse them well with water. Do not wash your genitals with CHG. Wash your back and under your arms. Make sure to wash skin folds. Let the CHG sit on your skin for 1-2 minutes or as long as told. Rinse your entire body in the shower, including all body creases and folds. Turn off the shower. Dry off with a clean towel. Do not put anything on your skin afterward, such as powder, lotion, or perfume. Put on clean clothes or pajamas. If it's the night before surgery, sleep in clean sheets. General tips Use CHG only as told, and follow the instructions on the label. Use the full amount of CHG as told. This is often one bottle. Do not smoke and stay away from flames after using CHG. Your skin may feel sticky after  using CHG. This is normal. The sticky feeling will go away as the CHG dries. Do not use CHG: If you have a chlorhexidine allergy or have reacted to chlorhexidine in the past. On open wounds or areas of skin that have broken skin, cuts, or scrapes. On babies younger than 110 months of age. Contact a health care provider if: You have questions about using CHG. Your skin gets irritated or itchy. You have a rash after using CHG. You swallow any CHG. Call your local poison control center [phone] in the [place] Your eyes itch badly, or they become very red or swollen. Your hearing changes. You have trouble seeing. If you can't reach your provider, go to an urgent care or emergency room. Do not drive yourself. Get help right away if: You have swelling or tingling in your mouth or throat. You  make high-pitched whistling sounds when you breathe, most often when you breathe out (wheeze). You have trouble breathing. These symptoms may be an emergency. Call 911 right away. Do not wait to see if the symptoms will go away. Do not drive yourself to the hospital. This information is not intended to replace advice given to you by your health care provider. Make sure you discuss any questions you have with your health care provider. Document Revised: 10/20/2022 Document Reviewed: 10/16/2021 Elsevier Patient Education  2024 [employer] Minimally Invasive Cholecystectomy, Care After The following information offers guidance on how to care for yourself after your procedure. Your health care provider may also give you more specific instructions. If you have problems or questions, contact your health care provider. What can I expect after the procedure? After the procedure, it is common to have: Pain at your incision sites. You will be given medicines to control this pain. Mild nausea or vomiting. Bloating and possible shoulder pain from the gas that was used during the procedure. Follow these instructions at home: Medicines Take over-the-counter and prescription medicines only as told by your health care provider. If you were prescribed an antibiotic medicine, take it as told by your health care provider. Do not stop using the antibiotic even if you start to feel better. Ask your health care provider if the medicine prescribed to you: Requires you to avoid driving or using machinery. Can cause constipation. You may need to take these actions to prevent or treat constipation: Drink enough fluid to keep your urine pale yellow. Take over-the-counter or prescription medicines. Eat foods that are high in fiber, such as beans, whole grains, and fresh fruits and vegetables. Limit foods that are high in fat and processed sugars, such as fried or sweet foods. Incision care  Follow instructions  from your health care provider about how to take care of your incisions. Make sure you: Wash your hands with soap and water for at least 20 seconds before and after you change your bandage (dressing). If soap and water are not available, use hand sanitizer. Change your dressing as told by your health care provider. Leave stitches (sutures), skin glue, or adhesive strips in place. These skin closures may need to be in place for 2 weeks or longer. If adhesive strip edges start to loosen and curl up, you may trim the loose edges. Do not remove adhesive strips completely unless your health care provider tells you to do that. Do not take baths, swim, or use a hot tub until your health care provider approves. Ask your health care provider if you may take showers. You may only be  allowed to take sponge baths. Check your incision area every day for signs of infection. Check for: More redness, swelling, or pain. Fluid or blood. Warmth. Pus or a bad smell. Activity Rest as told by your health care provider. Do not do activities that require a lot of effort. Avoid sitting for a long time without moving. Get up to take short walks every 1-2 hours. This is important to improve blood flow and breathing. Ask for help if you feel weak or unsteady. Do not lift anything that is heavier than 10 lb (4.5 kg), or the limit that you are told, until your health care provider says that it is safe. Do not play contact sports until your health care provider approves. Do not return to work or school until your health care provider approves. Return to your normal activities as told by your health care provider. Ask your health care provider what activities are safe for you. General instructions If you were given a sedative during the procedure, it can affect you for several hours. Do not drive or operate machinery until your health care provider says that it is safe. Keep all follow-up visits. This is important. Contact a  health care provider if: You develop a rash. You have more redness, swelling, or pain around your incisions. You have fluid or blood coming from your incisions. Your incisions feel warm to the touch. You have pus or a bad smell coming from your incisions. You have a fever. One or more of your incisions breaks open. Get help right away if: You have trouble breathing. You have chest pain. You have more pain in your shoulders. You faint or feel dizzy when you stand. You have severe pain in your abdomen. You have nausea or vomiting that lasts for more than one day. You have leg pain that is new or unusual, or if it is localized to one specific spot. These symptoms may represent a serious problem that is an emergency. Do not wait to see if the symptoms will go away. Get medical help right away. Call your local emergency services (911 in the [place] Do not drive yourself to the hospital. Summary After your procedure, it is common to have pain at the incision sites. You may also have nausea or bloating. Follow your health care provider's instructions about medicine, activity restrictions, and caring for your incision areas. Do not do activities that require a lot of effort. Contact a health care provider if you have a fever or other signs of infection, such as more redness, swelling, or pain around the incisions. Get help right away if you have chest pain, increasing pain in the shoulders, or trouble breathing. This information is not intended to replace advice given to you by your health care provider. Make sure you discuss any questions you have with your health care provider. Document Revised: 10/07/2020 Document Reviewed: 10/08/2020 Elsevier Patient Education  2024 [employer] Inc. General Anesthesia, Adult, Care After The following information offers guidance on how to care for yourself after your procedure. Your health care provider may also give you more specific instructions. If you have  problems or questions, contact your health care provider. What can I expect after the procedure? After the procedure, it is common for people to: Have pain or discomfort at the IV site. Have nausea or vomiting. Have a sore throat or hoarseness. Have trouble concentrating. Feel cold or chills. Feel weak, sleepy, or tired (fatigue). Have soreness and body aches. These can affect parts of  the body that were not involved in surgery. Follow these instructions at home: For the time period you were told by your health care provider:  Rest. Do not participate in activities where you could fall or become injured. Do not drive or use machinery. Do not drink alcohol. Do not take sleeping pills or medicines that cause drowsiness. Do not make important decisions or sign legal documents. Do not take care of children on your own. General instructions Drink enough fluid to keep your urine pale yellow. If you have sleep apnea, surgery and certain medicines can increase your risk for breathing problems. Follow instructions from your health care provider about wearing your sleep device: Anytime you are sleeping, including during daytime naps. While taking prescription pain medicines, sleeping medicines, or medicines that make you drowsy. Return to your normal activities as told by your health care provider. Ask your health care provider what activities are safe for you. Take over-the-counter and prescription medicines only as told by your health care provider. Do not use any products that contain nicotine or tobacco. These products include cigarettes, chewing tobacco, and vaping devices, such as e-cigarettes. These can delay incision healing after surgery. If you need help quitting, ask your health care provider. Contact a health care provider if: You have nausea or vomiting that does not get better with medicine. You vomit every time you eat or drink. You have pain that does not get better with  medicine. You cannot urinate or have bloody urine. You develop a skin rash. You have a fever. Get help right away if: You have trouble breathing. You have chest pain. You vomit blood. These symptoms may be an emergency. Get help right away. Call 911. Do not wait to see if the symptoms will go away. Do not drive yourself to the hospital. Summary After the procedure, it is common to have a sore throat, hoarseness, nausea, vomiting, or to feel weak, sleepy, or fatigue. For the time period you were told by your health care provider, do not drive or use machinery. Get help right away if you have difficulty breathing, have chest pain, or vomit blood. These symptoms may be an emergency. This information is not intended to replace advice given to you by your health care provider. Make sure you discuss any questions you have with your health care provider. Document Revised: 07/04/2021 Document Reviewed: 07/04/2021 Elsevier Patient Education  2024 [employer].

## 2023-05-24 ENCOUNTER — Encounter (HOSPITAL_COMMUNITY): Payer: Self-pay

## 2023-05-24 ENCOUNTER — Encounter (HOSPITAL_COMMUNITY)
Admission: RE | Admit: 2023-05-24 | Discharge: 2023-05-24 | Disposition: A | Discharge status: Home / Self Care | Payer: 59 | Source: Ambulatory Visit | Attending: Surgery | Admitting: Surgery

## 2023-05-24 DIAGNOSIS — Z01812 Encounter for preprocedural laboratory examination: Secondary | ICD-10-CM | POA: Insufficient documentation

## 2023-05-24 DIAGNOSIS — Z01818 Encounter for other preprocedural examination: Secondary | ICD-10-CM

## 2023-05-25 ENCOUNTER — Other Ambulatory Visit (HOSPITAL_COMMUNITY): Payer: 59

## 2023-05-25 LAB — POCT PREGNANCY, URINE: Preg Test, Ur: NEGATIVE

## 2023-05-26 NOTE — Anesthesia Preprocedure Evaluation (Addendum)
 Anesthesia Evaluation  Patient identified by MRN, date of birth, ID band Patient awake    Reviewed: Allergy & Precautions, H&P , NPO status , Patient's Chart, lab work & pertinent test results, reviewed documented beta blocker date and time   Airway Mallampati: II  TM Distance: >3 FB Neck ROM: full    Dental no notable dental hx. (+) Dental Advisory Given, Teeth Intact   Pulmonary neg pulmonary ROS   Pulmonary exam normal breath sounds clear to auscultation       Cardiovascular Exercise Tolerance: Good negative cardio ROS Normal cardiovascular exam Rhythm:regular Rate:Normal     Neuro/Psych negative neurological ROS  negative psych ROS   GI/Hepatic negative GI ROS, Neg liver ROS,,,  Endo/Other  negative endocrine ROS    Renal/GU negative Renal ROS  negative genitourinary   Musculoskeletal   Abdominal   Peds  Hematology negative hematology ROS (+)   Anesthesia Other Findings   Reproductive/Obstetrics negative OB ROS                             Anesthesia Physical Anesthesia Plan  ASA: 1  Anesthesia Plan: General   Post-op Pain Management: Dilaudid  IV   Induction:   PONV Risk Score and Plan: 3 and Ondansetron , Dexamethasone  and Midazolam   Airway Management Planned: Oral ETT  Additional Equipment: None  Intra-op Plan:   Post-operative Plan: Extubation in OR  Informed Consent: I have reviewed the patients History and Physical, chart, labs and discussed the procedure including the risks, benefits and alternatives for the proposed anesthesia with the patient or authorized representative who has indicated his/her understanding and acceptance.     Dental Advisory Given  Plan Discussed with: CRNA  Anesthesia Plan Comments:        Anesthesia Quick Evaluation

## 2023-05-27 ENCOUNTER — Encounter (HOSPITAL_COMMUNITY): Payer: Self-pay | Admitting: Surgery

## 2023-05-27 ENCOUNTER — Ambulatory Visit (HOSPITAL_COMMUNITY): Payer: Self-pay | Admitting: Certified Registered"

## 2023-05-27 ENCOUNTER — Other Ambulatory Visit: Payer: Self-pay

## 2023-05-27 ENCOUNTER — Ambulatory Visit (HOSPITAL_BASED_OUTPATIENT_CLINIC_OR_DEPARTMENT_OTHER): Payer: Self-pay | Admitting: Certified Registered"

## 2023-05-27 ENCOUNTER — Encounter (HOSPITAL_COMMUNITY)
Admission: RE | Disposition: A | Discharge status: Home / Self Care | Payer: Self-pay | Source: Home / Self Care | Attending: Surgery

## 2023-05-27 ENCOUNTER — Ambulatory Visit (HOSPITAL_COMMUNITY)
Admission: RE | Admit: 2023-05-27 | Discharge: 2023-05-27 | Disposition: A | Discharge status: Home / Self Care | Payer: 59 | Attending: Surgery | Admitting: Surgery

## 2023-05-27 DIAGNOSIS — F988 Other specified behavioral and emotional disorders with onset usually occurring in childhood and adolescence: Secondary | ICD-10-CM | POA: Diagnosis not present

## 2023-05-27 DIAGNOSIS — K8020 Calculus of gallbladder without cholecystitis without obstruction: Secondary | ICD-10-CM

## 2023-05-27 DIAGNOSIS — K8010 Calculus of gallbladder with chronic cholecystitis without obstruction: Secondary | ICD-10-CM | POA: Insufficient documentation

## 2023-05-27 SURGERY — CHOLECYSTECTOMY, ROBOT-ASSISTED, LAPAROSCOPIC
Anesthesia: General | Site: Abdomen

## 2023-05-27 MED ORDER — INDOCYANINE GREEN 25 MG IV SOLR: 2.5000 mg | Freq: Once | INTRAVENOUS | Status: AC

## 2023-05-27 MED ORDER — BUPIVACAINE HCL (PF) 0.5 % IJ SOLN
INTRAMUSCULAR | Status: AC
  Filled 2023-05-27: qty 30

## 2023-05-27 MED ORDER — BUPIVACAINE HCL (PF) 0.5 % IJ SOLN
INTRAMUSCULAR | Status: DC | PRN
  Administered 2023-05-27: 30 mL

## 2023-05-27 MED ORDER — INDOCYANINE GREEN 25 MG IV SOLR
INTRAVENOUS | Status: AC
  Administered 2023-05-27: 2.5 mg via INTRAVENOUS
  Filled 2023-05-27: qty 10

## 2023-05-27 MED ORDER — SUGAMMADEX SODIUM 200 MG/2ML IV SOLN
INTRAVENOUS | Status: DC | PRN
  Administered 2023-05-27: 200 mg via INTRAVENOUS

## 2023-05-27 MED ORDER — MIDAZOLAM HCL 2 MG/2ML IJ SOLN
INTRAMUSCULAR | Status: AC
Start: 2023-05-27 — End: ?
  Filled 2023-05-27: qty 2

## 2023-05-27 MED ORDER — HYDROMORPHONE HCL 1 MG/ML IJ SOLN
0.2500 mg | INTRAMUSCULAR | Status: DC | PRN
  Administered 2023-05-27 (×2): 0.5 mg via INTRAVENOUS
  Filled 2023-05-27 (×2): qty 0.5

## 2023-05-27 MED ORDER — PHENYLEPHRINE 80 MCG/ML (10ML) SYRINGE FOR IV PUSH (FOR BLOOD PRESSURE SUPPORT)
PREFILLED_SYRINGE | INTRAVENOUS | Status: DC | PRN
  Administered 2023-05-27: 80 ug via INTRAVENOUS

## 2023-05-27 MED ORDER — ROCURONIUM BROMIDE 10 MG/ML (PF) SYRINGE
PREFILLED_SYRINGE | INTRAVENOUS | Status: DC | PRN
  Administered 2023-05-27: 20 mg via INTRAVENOUS
  Administered 2023-05-27: 10 mg via INTRAVENOUS
  Administered 2023-05-27: 20 mg via INTRAVENOUS

## 2023-05-27 MED ORDER — STERILE WATER FOR IRRIGATION IR SOLN
Status: DC | PRN
  Administered 2023-05-27: 1000 mL

## 2023-05-27 MED ORDER — DEXAMETHASONE SODIUM PHOSPHATE 10 MG/ML IJ SOLN
INTRAMUSCULAR | Status: AC
  Filled 2023-05-27: qty 1

## 2023-05-27 MED ORDER — SODIUM CHLORIDE 0.9 % IV SOLN
2.0000 g | INTRAVENOUS | Status: AC
  Administered 2023-05-27: 2 g via INTRAVENOUS
  Filled 2023-05-27: qty 2

## 2023-05-27 MED ORDER — MIDAZOLAM HCL 2 MG/2ML IJ SOLN
INTRAMUSCULAR | Status: DC | PRN
  Administered 2023-05-27: 2 mg via INTRAVENOUS

## 2023-05-27 MED ORDER — CHLORHEXIDINE GLUCONATE CLOTH 2 % EX PADS: 6.0000 | MEDICATED_PAD | Freq: Once | CUTANEOUS | Status: DC

## 2023-05-27 MED ORDER — DOCUSATE SODIUM 100 MG PO CAPS: 100.0000 mg | ORAL_CAPSULE | Freq: Two times a day (BID) | ORAL | 2 refills | Status: AC

## 2023-05-27 MED ORDER — SCOPOLAMINE 1 MG/3DAYS TD PT72
1.0000 | MEDICATED_PATCH | TRANSDERMAL | Status: DC
  Administered 2023-05-27: 1.5 mg via TRANSDERMAL

## 2023-05-27 MED ORDER — ACETAMINOPHEN 500 MG PO TABS: 1000.0000 mg | ORAL_TABLET | Freq: Once | ORAL | Status: AC

## 2023-05-27 MED ORDER — DEXMEDETOMIDINE HCL IN NACL 80 MCG/20ML IV SOLN
INTRAVENOUS | Status: AC
  Filled 2023-05-27: qty 20

## 2023-05-27 MED ORDER — SEVOFLURANE IN SOLN
RESPIRATORY_TRACT | Status: AC
  Filled 2023-05-27: qty 250

## 2023-05-27 MED ORDER — SUCCINYLCHOLINE CHLORIDE 200 MG/10ML IV SOSY
PREFILLED_SYRINGE | INTRAVENOUS | Status: AC
  Filled 2023-05-27: qty 10

## 2023-05-27 MED ORDER — SUCCINYLCHOLINE CHLORIDE 200 MG/10ML IV SOSY
PREFILLED_SYRINGE | INTRAVENOUS | Status: DC | PRN
  Administered 2023-05-27: 100 mg via INTRAVENOUS

## 2023-05-27 MED ORDER — FENTANYL CITRATE (PF) 100 MCG/2ML IJ SOLN
INTRAMUSCULAR | Status: DC | PRN
  Administered 2023-05-27: 25 ug via INTRAVENOUS
  Administered 2023-05-27: 50 ug via INTRAVENOUS
  Administered 2023-05-27: 25 ug via INTRAVENOUS

## 2023-05-27 MED ORDER — ONDANSETRON HCL 4 MG/2ML IJ SOLN
INTRAMUSCULAR | Status: AC
  Filled 2023-05-27: qty 2

## 2023-05-27 MED ORDER — ONDANSETRON HCL 4 MG/2ML IJ SOLN
INTRAMUSCULAR | Status: DC | PRN
  Administered 2023-05-27: 4 mg via INTRAVENOUS

## 2023-05-27 MED ORDER — FENTANYL CITRATE (PF) 100 MCG/2ML IJ SOLN
INTRAMUSCULAR | Status: AC
  Filled 2023-05-27: qty 2

## 2023-05-27 MED ORDER — CHLORHEXIDINE GLUCONATE 0.12 % MT SOLN
15.0000 mL | Freq: Once | OROMUCOSAL | Status: AC
Start: 2023-05-27 — End: 2023-05-27
  Administered 2023-05-27: 15 mL via OROMUCOSAL

## 2023-05-27 MED ORDER — DEXMEDETOMIDINE HCL IN NACL 80 MCG/20ML IV SOLN
INTRAVENOUS | Status: DC | PRN
  Administered 2023-05-27 (×3): 8 ug via INTRAVENOUS

## 2023-05-27 MED ORDER — ACETAMINOPHEN 500 MG PO TABS: 1000.0000 mg | ORAL_TABLET | Freq: Four times a day (QID) | ORAL | 0 refills | Status: AC

## 2023-05-27 MED ORDER — CHLORHEXIDINE GLUCONATE CLOTH 2 % EX PADS
6.0000 | MEDICATED_PAD | Freq: Once | CUTANEOUS | Status: AC
Start: 2023-05-27 — End: 2023-05-27
  Administered 2023-05-27: 6 via TOPICAL

## 2023-05-27 MED ORDER — LACTATED RINGERS IV SOLN: INTRAVENOUS | Status: AC

## 2023-05-27 MED ORDER — OXYCODONE HCL 5 MG PO TABS
5.0000 mg | ORAL_TABLET | Freq: Once | ORAL | Status: AC | PRN
  Administered 2023-05-27: 5 mg via ORAL
  Filled 2023-05-27: qty 1

## 2023-05-27 MED ORDER — ACETAMINOPHEN 160 MG/5ML PO SOLN
960.0000 mg | Freq: Once | ORAL | Status: AC
  Administered 2023-05-27: 960 mg via ORAL
  Filled 2023-05-27: qty 30

## 2023-05-27 MED ORDER — LACTATED RINGERS IV SOLN: INTRAVENOUS | Status: DC

## 2023-05-27 MED ORDER — ORAL CARE MOUTH RINSE: 15.0000 mL | Freq: Once | OROMUCOSAL | Status: AC

## 2023-05-27 MED ORDER — PROPOFOL 10 MG/ML IV BOLUS
INTRAVENOUS | Status: DC | PRN
  Administered 2023-05-27: 50 mg via INTRAVENOUS
  Administered 2023-05-27: 150 ug via INTRAVENOUS

## 2023-05-27 MED ORDER — LIDOCAINE HCL (PF) 2 % IJ SOLN
INTRAMUSCULAR | Status: DC | PRN
  Administered 2023-05-27: 60 mg via INTRADERMAL

## 2023-05-27 MED ORDER — SODIUM CHLORIDE 0.9 % IV SOLN: 12.5000 mg | INTRAVENOUS | Status: DC | PRN

## 2023-05-27 MED ORDER — SCOPOLAMINE 1 MG/3DAYS TD PT72
MEDICATED_PATCH | TRANSDERMAL | Status: AC
  Filled 2023-05-27: qty 1

## 2023-05-27 MED ORDER — OXYCODONE HCL 5 MG PO TABS: 5.0000 mg | ORAL_TABLET | Freq: Four times a day (QID) | ORAL | 0 refills | Status: AC | PRN

## 2023-05-27 MED ORDER — DEXAMETHASONE SODIUM PHOSPHATE 10 MG/ML IJ SOLN
INTRAMUSCULAR | Status: DC | PRN
  Administered 2023-05-27: 5 mg via INTRAVENOUS

## 2023-05-27 MED ORDER — OXYCODONE HCL 5 MG/5ML PO SOLN: 5.0000 mg | Freq: Once | ORAL | Status: AC | PRN

## 2023-05-27 MED ORDER — ROCURONIUM BROMIDE 10 MG/ML (PF) SYRINGE
PREFILLED_SYRINGE | INTRAVENOUS | Status: AC
  Filled 2023-05-27: qty 10

## 2023-05-27 SURGICAL SUPPLY — 40 items
BLADE SURG 15 STRL LF DISP TIS (BLADE) ×1 IMPLANT
CAUTERY HOOK MNPLR 1.6 DVNC XI (INSTRUMENTS) ×1 IMPLANT
CHLORAPREP W/TINT 26 (MISCELLANEOUS) ×1 IMPLANT
CLIP LIGATING HEM O LOK PURPLE (MISCELLANEOUS) ×1 IMPLANT
DEFOGGER SCOPE WARMER CLEARIFY (MISCELLANEOUS) ×1 IMPLANT
DERMABOND ADVANCED .7 DNX12 (GAUZE/BANDAGES/DRESSINGS) ×1 IMPLANT
DRAPE ARM DVNC X/XI (DISPOSABLE) ×4 IMPLANT
DRAPE COLUMN DVNC XI (DISPOSABLE) ×1 IMPLANT
ELECT REM PT RETURN 9FT ADLT (ELECTROSURGICAL) ×1
ELECTRODE REM PT RTRN 9FT ADLT (ELECTROSURGICAL) ×1 IMPLANT
FORCEPS BPLR R/ABLATION 8 DVNC (INSTRUMENTS) ×1 IMPLANT
FORCEPS PROGRASP DVNC XI (FORCEP) ×1 IMPLANT
GLOVE BIO SURGEON STRL SZ7 (GLOVE) IMPLANT
GLOVE BIOGEL PI IND STRL 6.5 (GLOVE) ×2 IMPLANT
GLOVE BIOGEL PI IND STRL 7.0 (GLOVE) ×3 IMPLANT
GLOVE SURG SS PI 6.5 STRL IVOR (GLOVE) ×2 IMPLANT
GOWN STRL REUS W/TWL LRG LVL3 (GOWN DISPOSABLE) ×3 IMPLANT
GRASPER SUT TROCAR 14GX15 (MISCELLANEOUS) ×1 IMPLANT
IRRIGATOR SUCT 8 DISP DVNC XI (IRRIGATION / IRRIGATOR) IMPLANT
KIT TURNOVER KIT A (KITS) ×1 IMPLANT
MANIFOLD NEPTUNE II (INSTRUMENTS) ×1 IMPLANT
NDL HYPO 21X1.5 SAFETY (NEEDLE) ×1 IMPLANT
NDL INSUFFLATION 14GA 120MM (NEEDLE) ×1 IMPLANT
NEEDLE HYPO 21X1.5 SAFETY (NEEDLE) ×1
NEEDLE INSUFFLATION 14GA 120MM (NEEDLE) ×1
OBTURATOR OPTICAL STND 8 DVNC (TROCAR) ×1
OBTURATOR OPTICALSTD 8 DVNC (TROCAR) ×1 IMPLANT
PACK LAP CHOLE LZT030E (CUSTOM PROCEDURE TRAY) ×1 IMPLANT
PAD ARMBOARD 7.5X6 YLW CONV (MISCELLANEOUS) ×1 IMPLANT
PENCIL HANDSWITCHING (ELECTRODE) IMPLANT
POSITIONER HEAD 8X9X4 ADT (SOFTGOODS) ×1 IMPLANT
SEAL UNIV 5-12 XI (MISCELLANEOUS) ×3 IMPLANT
SET BASIN LINEN APH (SET/KITS/TRAYS/PACK) ×1 IMPLANT
SET TUBE DA VINCI INSUFFLATOR (TUBING) IMPLANT
SUT MNCRL AB 4-0 PS2 18 (SUTURE) ×2 IMPLANT
SUT VICRYL 0 AB UR-6 (SUTURE) IMPLANT
SYR 30ML LL (SYRINGE) ×1 IMPLANT
SYS RETRIEVAL 5MM INZII UNIV (BASKET) ×1
SYSTEM RETRIEVL 5MM INZII UNIV (BASKET) IMPLANT
WATER STERILE IRR 500ML POUR (IV SOLUTION) ×1 IMPLANT

## 2023-05-27 NOTE — Anesthesia Procedure Notes (Addendum)
 Procedure Name: Intubation Date/Time: 05/27/2023 8:20 AM  Performed by: Vanice Search, RNPre-anesthesia Checklist: Patient identified, Emergency Drugs available, Suction available and Patient being monitored Patient Re-evaluated:Patient Re-evaluated prior to induction Oxygen Delivery Method: Circle system utilized Preoxygenation: Pre-oxygenation with 100% oxygen Induction Type: IV induction, Rapid sequence and Cricoid Pressure applied Laryngoscope Size: Glidescope and 3 Grade View: Grade I Tube type: Oral Tube size: 6.5 mm Number of attempts: 1 Airway Equipment and Method: Stylet and Video-laryngoscopy Placement Confirmation: ETT inserted through vocal cords under direct vision, positive ETCO2 and breath sounds checked- equal and bilateral Secured at: 22 cm Tube secured with: Tape Dental Injury: Teeth and Oropharynx as per pre-operative assessment  Comments: RSI r/t patient reports of nausea. Glidescope use elective.

## 2023-05-27 NOTE — Op Note (Signed)
 [facility] Operative Note  05/27/23  Preoperative Diagnosis: Symptomatic Cholelithiasis   Postoperative Diagnosis: Same   Procedure(s) Performed: Robotic Assisted Laparoscopic Cholecystectomy   Surgeon: Dorothyann Brittle, DO   Assistants: No qualified resident was available    Anesthesia: General endotracheal   Anesthesiologist: Landry Dunnings, MD    Specimens: Gallbladder   Estimated Blood Loss: Minimal   Blood Replacement: None    Complications: None   Wound Class: Clean contaminated   Operative Indications: The patient was found to have cholelithiasis on imaging and was symptomatic.  We discussed the risk of the procedure including but not limited to bleeding, infection, injury to the common bile duct, bile leak, need for further procedures, chance of subtotal cholecystectomy.   Findings:  Distended gallbladder without acute inflammation Critical view of safety noted All clips intact at the end of the case Adequate hemostasis   Procedure: Firefly was given in the preoperative area. The patient was taken to the operating room and placed supine. General endotracheal anesthesia was induced. Intravenous antibiotics were administered per protocol.  An orogastric tube positioned to decompress the stomach. The abdomen was prepared and draped in the usual sterile fashion. A time-out was completed verifying correct patient, procedure, site, positioning, and implant(s) and/or special equipment prior to beginning this procedure.  Veress needle was placed at the infraumbilical area and insufflation was started after confirming a positive saline drop test and no immediate increase in abdominal pressure.  After reaching 15 mm, the Veress needle was removed and a 8 mm port was placed via optiview technique infraumbilical, measuring 20 mm away from the suspected position of the gallbladder.  The abdomen was inspected and no abnormalities or injuries were found.  Under  direct vision, ports were placed in the following locations in a semi curvilinear position around the target of the gallbladder: Two 8 mm ports on the patient's right each having 8cm clearance to the adjacent ports and one 8 mm port placed on the patient's left 8 cm from the umbilical port. Once ports were placed, the table was placed in the reverse Trendelenburg position with the right side up. The Xi platform was brought into the operative field and docked to the ports successfully.  An endoscope was placed through the umbilical port, prograsp through the most lateral right port, fenestrated bipolar to the port just right of the umbilicus, and then a hook cautery in the left port.  The dome of the gallbladder was grasped with prograsp and retracted over the dome of the liver. Adhesions between the gallbladder and omentum, duodenum and transverse colon were lysed via hook cautery. The infundibulum was grasped with the fenestrated grasper and retracted toward the right lower quadrant. This maneuver exposed Calot's triangle. Firefly was used throughout the dissection to ensure safe visualization of the cystic duct.  The peritoneum overlying the gallbladder infundibulum was then dissected and the cystic duct and cystic artery identified.  Critical view of safety with the liver bed clearly visible behind the duct and artery with no additional structures noted.  The cystic duct and cystic artery were doubly clipped and divided close to the gallbladder.    The gallbladder was then dissected from its peritoneal and liver bed attachments by electrocautery. Hemostasis was checked prior to removing the hook cautery.  The Anton was undocked and moved out of the field.  A 5mm Endo Catch bag was then placed through the umbilical port and the gallbladder was removed.  The gallbladder was passed off the  table as a specimen. There was no evidence of bleeding from the gallbladder fossa or cystic artery or leakage of the bile from  the cystic duct stump. The umbilical port site closed with a 0 vicryl with a PMI needle.  The abdomen was desufflated and secondary trocars were removed under direct vision. No bleeding was noted. Incisions were localized with marcaine .  All skin incisions were closed with subcuticular sutures of 4-0 monocryl and dermabond.   Final inspection revealed acceptable hemostasis. All counts were correct at the end of the case. The patient was awakened from anesthesia and extubated without complication. The OG tube was removed.  The patient went to the PACU in stable condition.   Dorothyann Brittle, DO [facility] [address] [place], [place] [address] [phone] (office)

## 2023-05-27 NOTE — Progress Notes (Signed)
 [facility]  Spoke with the patient's fiance in the consultation room.  I explained that she tolerated the procedure without difficulty.  She has dissolvable stitches under the skin with overlying skin glue.  This will flake off in 10 to 14 days.  I discharged her home with a prescription for narcotic pain medication that they should take as needed for pain.  I also want her taking scheduled Tylenol .  If they take the narcotic pain medication, they should take a stool softener as well.  The patient will follow-up with me in 2 weeks for phone follow-up.  All questions were answered to his expressed satisfaction.  Dorothyann Brittle, DO [facility] [address] [place], [place] [address] [phone] (office)

## 2023-05-27 NOTE — Transfer of Care (Signed)
 Immediate Anesthesia Transfer of Care Note  Patient: Theresa Watts  Procedure(s) Performed: XI ROBOTIC ASSISTED LAPAROSCOPIC CHOLECYSTECTOMY (Abdomen)  Patient Location: PACU  Anesthesia Type:General  Level of Consciousness: drowsy  Airway & Oxygen Therapy: Patient Spontanous Breathing and Patient connected to nasal cannula oxygen  Post-op Assessment: Report given to RN and Post -op Vital signs reviewed and stable  Post vital signs: Reviewed and stable  Last Vitals:  Vitals Value Taken Time  BP 125/80 05/27/23 1002  Temp    Pulse 69 05/27/23 1003  Resp 19 05/27/23 1003  SpO2 100 % 05/27/23 1003  Vitals shown include unfiled device data.  Last Pain:  Vitals:   05/27/23 0634  TempSrc: Oral  PainSc: 0-No pain      Patients Stated Pain Goal: 5 (05/27/23 9365)  Complications: No notable events documented.

## 2023-05-27 NOTE — Anesthesia Postprocedure Evaluation (Signed)
 Anesthesia Post Note  Patient: Theresa Watts  Procedure(s) Performed: XI ROBOTIC ASSISTED LAPAROSCOPIC CHOLECYSTECTOMY (Abdomen)  Patient location during evaluation: PACU Anesthesia Type: General Level of consciousness: awake and alert Pain management: pain level controlled Vital Signs Assessment: post-procedure vital signs reviewed and stable Respiratory status: spontaneous breathing, nonlabored ventilation, respiratory function stable and patient connected to nasal cannula oxygen Cardiovascular status: blood pressure returned to baseline and stable Postop Assessment: no apparent nausea or vomiting Anesthetic complications: no   There were no known notable events for this encounter.   Last Vitals:  Vitals:   05/27/23 1015 05/27/23 1030  BP: (!) 126/92 132/89  Pulse: 68 63  Resp: 13 (!) 9  Temp:    SpO2: 100% 100%    Last Pain:  Vitals:   05/27/23 1030  TempSrc:   PainSc: Asleep                 Stuart Guillen L Shirl Weir

## 2023-05-27 NOTE — Discharge Instructions (Signed)
 Ambulatory Surgery Discharge Instructions  General Anesthesia or Sedation Do not drive or operate heavy machinery for 24 hours.  Do not consume alcohol, tranquilizers, sleeping medications, or any non-prescribed medications for 24 hours. Do not make important decisions or sign any important papers in the next 24 hours. You should have someone with you tonight at home.  Activity  You are advised to go directly home from the hospital.  Restrict your activities and rest for a day.  Resume light activity tomorrow. No heavy lifting over 10 lbs or strenuous exercise.  Fluids and Diet Begin with clear liquids, bouillon, dry toast, soda crackers.  If not nauseated, you may go to a regular diet when you desire.  Greasy and spicy foods are not advised.  Medications  If you have not had a bowel movement in 24 hours, take 2 tablespoons over the counter Milk of mag.             You May resume your blood thinners tomorrow (Aspirin, coumadin, or other).  You are being discharged with prescriptions for Opioid/Narcotic Medications: There are some specific considerations for these medications that you should know. Opioid Meds have risks & benefits. Addiction to these meds is always a concern with prolonged use Take medication only as directed Do not drive while taking narcotic pain medication Do not crush tablets or capsules Do not use a different container than medication was dispensed in Lock the container of medication in a cool, dry place out of reach of children and pets. Opioid medication can cause addiction Do not share with anyone else (this is a felony) Do not store medications for future use. Dispose of them properly.     Disposal:  Find a [employer]  household drug take back site near you.  If you can't get to a drug take back site, use the recipe below as a last resort to dispose of expired, unused or unwanted drugs. Disposal  (Do not dispose chemotherapy drugs this way, talk to your  prescribing doctor instead.) Step 1: Mix drugs (do not crush) with dirt, kitty litter, or used coffee grounds and add a small amount of water  to dissolve any solid medications. Step 2: Seal drugs in plastic bag. Step 3: Place plastic bag in trash. Step 4: Take prescription container and scratch out personal information, then recycle or throw away.  Operative Site  You have a liquid bandage over your incisions, this will begin to flake off in about a week. 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 Keep wound clean and dry. No baths or swimming. No lifting more than 10 pounds.  Contact Information: If you have questions or concerns, please call our office, [phone], Monday- Thursday 8AM-5PM and Friday 8AM-12Noon.  If it is after hours or on the weekend, please call [facility], [phone], and ask to speak to the surgeon on call for Dr. Evonnie at [facility].   SPECIFIC COMPLICATIONS TO WATCH FOR: Inability to urinate Fever over 101? F by mouth Nausea and vomiting lasting longer than 24 hours. Pain not relieved by medication ordered Swelling around the operative site Increased redness, warmth, hardness, around operative area Numbness, tingling, or cold fingers or toes Blood -soaked dressing, (small amounts of oozing may be normal) Increasing and progressive drainage from surgical area or exam site

## 2023-05-27 NOTE — Interval H&P Note (Signed)
 History and Physical Interval Note:  05/27/2023 7:53 AM  Theresa Watts  has presented today for surgery, with the diagnosis of CHOLELITHIASIS.  The various methods of treatment have been discussed with the patient and family. After consideration of risks, benefits and other options for treatment, the patient has consented to  Procedure(s): XI ROBOTIC ASSISTED LAPAROSCOPIC CHOLECYSTECTOMY (N/A) as a surgical intervention.  The patient's history has been reviewed, patient examined, no change in status, stable for surgery.  I have reviewed the patient's chart and labs.  Questions were answered to the patient's satisfaction.     Wesson Stith A Jartavious Mckimmy

## 2023-05-27 NOTE — ED Notes (Signed)
  May 27, 2023  Patient: Theresa Watts  Date of Birth: [birth year]  Date of Visit: 05/27/2023    To Whom It May Concern:  Theresa Watts was seen and treated in our short stay surgery center on 05/27/2023. Theresa Watts  may return to work on 06/02/23 with light duty, restrictions of no heavy lifting greater than 10 lbs for 2 weeks.    Sincerely,   Tye Senters, RN / Dr.Pappayliou

## 2023-05-31 LAB — SURGICAL PATHOLOGY

## 2023-06-02 ENCOUNTER — Other Ambulatory Visit: Payer: Self-pay | Admitting: Family Medicine

## 2023-06-02 DIAGNOSIS — K8010 Calculus of gallbladder with chronic cholecystitis without obstruction: Secondary | ICD-10-CM

## 2023-06-07 ENCOUNTER — Telehealth: Payer: Self-pay | Admitting: Family Medicine

## 2023-06-07 NOTE — Telephone Encounter (Signed)
 FMLA Paperwork completed and patient or patients boyfriend will come by office and pick it up. Paperwork left up front for patient.   Out of work starting 05/27/2023 and may return to work unrestricted on 06/14/2023.

## 2023-06-08 ENCOUNTER — Ambulatory Visit (INDEPENDENT_AMBULATORY_CARE_PROVIDER_SITE_OTHER): Payer: 59 | Admitting: Surgery

## 2023-06-08 DIAGNOSIS — Z09 Encounter for follow-up examination after completed treatment for conditions other than malignant neoplasm: Secondary | ICD-10-CM

## 2023-06-08 NOTE — Progress Notes (Signed)
 [facility]  I am calling the patient for post operative evaluation. This is not a billable encounter as it is under the global charges for the surgery.  The patient had a robotic assisted laparoscopic cholecystectomy on 2/6. The patient reports that she is doing well. She is tolerating a diet, having good pain control, and having regular Bms.  The incisions are healing well.  Her skin glue is starting to flake off.  Advised that she can use antibiotic ointment prior to showering to help loosen the remaining skin glue. The patient has no concerns.   Pathology: A. GALLBLADDER, CHOLECYSTECTOMY:       Chronic cholecystitis.       Cholelithiasis.   Will see the patient PRN.   Theophilus Kinds, DO [facility] [address] [place], [place] [address] [phone] (office)

## 2023-06-09 ENCOUNTER — Encounter: Payer: 59 | Admitting: Surgery

## 2024-01-24 DIAGNOSIS — Z3201 Encounter for pregnancy test, result positive: Secondary | ICD-10-CM | POA: Diagnosis not present

## 2024-01-24 DIAGNOSIS — Z3A01 Less than 8 weeks gestation of pregnancy: Secondary | ICD-10-CM | POA: Diagnosis not present

## 2024-01-24 DIAGNOSIS — N912 Amenorrhea, unspecified: Secondary | ICD-10-CM | POA: Diagnosis not present

## 2024-01-24 DIAGNOSIS — O3680X0 Pregnancy with inconclusive fetal viability, not applicable or unspecified: Secondary | ICD-10-CM | POA: Diagnosis not present

## 2024-01-24 DIAGNOSIS — Z3491 Encounter for supervision of normal pregnancy, unspecified, first trimester: Secondary | ICD-10-CM | POA: Diagnosis not present

## 2024-02-16 DIAGNOSIS — O3680X0 Pregnancy with inconclusive fetal viability, not applicable or unspecified: Secondary | ICD-10-CM | POA: Diagnosis not present

## 2024-02-16 DIAGNOSIS — Z3A09 9 weeks gestation of pregnancy: Secondary | ICD-10-CM | POA: Diagnosis not present

## 2024-02-16 DIAGNOSIS — Z124 Encounter for screening for malignant neoplasm of cervix: Secondary | ICD-10-CM | POA: Diagnosis not present

## 2024-02-16 DIAGNOSIS — Z3689 Encounter for other specified antenatal screening: Secondary | ICD-10-CM | POA: Diagnosis not present

## 2024-02-16 DIAGNOSIS — O208 Other hemorrhage in early pregnancy: Secondary | ICD-10-CM | POA: Diagnosis not present

## 2024-02-16 DIAGNOSIS — O3680X1 Pregnancy with inconclusive fetal viability, fetus 1: Secondary | ICD-10-CM | POA: Diagnosis not present

## 2024-03-01 DIAGNOSIS — Z3689 Encounter for other specified antenatal screening: Secondary | ICD-10-CM | POA: Diagnosis not present
